# Patient Record
Sex: Female | Born: 1995 | Race: White | Hispanic: No | Marital: Single | Smoking: Never smoker
Health system: Southern US, Community
[De-identification: ages and names within clinical notes are randomized; demographics above are authoritative.]

## PROBLEM LIST (undated history)

## (undated) DIAGNOSIS — I10 Essential (primary) hypertension: Secondary | ICD-10-CM

## (undated) DIAGNOSIS — N7011 Chronic salpingitis: Secondary | ICD-10-CM

## (undated) DIAGNOSIS — T7840XA Allergy, unspecified, initial encounter: Secondary | ICD-10-CM

## (undated) DIAGNOSIS — F419 Anxiety disorder, unspecified: Secondary | ICD-10-CM

## (undated) DIAGNOSIS — N83209 Unspecified ovarian cyst, unspecified side: Secondary | ICD-10-CM

## (undated) HISTORY — PX: COLON SURGERY: SHX602

## (undated) HISTORY — DX: Essential (primary) hypertension: I10

## (undated) HISTORY — DX: Chronic salpingitis: N70.11

## (undated) HISTORY — DX: Unspecified ovarian cyst, unspecified side: N83.209

## (undated) HISTORY — PX: TONSILLECTOMY: SUR1361

## (undated) HISTORY — DX: Anxiety disorder, unspecified: F41.9

## (undated) HISTORY — DX: Allergy, unspecified, initial encounter: T78.40XA

---
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
## 2014-05-19 DIAGNOSIS — N809 Endometriosis, unspecified: Secondary | ICD-10-CM | POA: Insufficient documentation

## 2014-06-23 ENCOUNTER — Emergency Department: Payer: Self-pay | Admitting: Emergency Medicine

## 2014-06-23 DIAGNOSIS — E669 Obesity, unspecified: Secondary | ICD-10-CM | POA: Insufficient documentation

## 2014-06-23 DIAGNOSIS — Z8742 Personal history of other diseases of the female genital tract: Secondary | ICD-10-CM | POA: Insufficient documentation

## 2014-06-23 DIAGNOSIS — K5909 Other constipation: Secondary | ICD-10-CM | POA: Insufficient documentation

## 2014-06-23 DIAGNOSIS — N7011 Chronic salpingitis: Secondary | ICD-10-CM | POA: Insufficient documentation

## 2014-06-23 LAB — CBC
HCT: 38.1 % (ref 35.0–47.0)
HGB: 12 g/dL (ref 12.0–16.0)
MCH: 26.5 pg (ref 26.0–34.0)
MCHC: 31.6 g/dL — AB (ref 32.0–36.0)
MCV: 84 fL (ref 80–100)
PLATELETS: 300 10*3/uL (ref 150–440)
RBC: 4.54 10*6/uL (ref 3.80–5.20)
RDW: 15.3 % — ABNORMAL HIGH (ref 11.5–14.5)
WBC: 13.1 10*3/uL — ABNORMAL HIGH (ref 3.6–11.0)

## 2014-06-23 LAB — COMPREHENSIVE METABOLIC PANEL
Albumin: 3.7 g/dL — ABNORMAL LOW (ref 3.8–5.6)
Alkaline Phosphatase: 56 U/L
Anion Gap: 8 (ref 7–16)
BUN: 11 mg/dL (ref 7–18)
Bilirubin,Total: 0.7 mg/dL (ref 0.2–1.0)
CALCIUM: 8.9 mg/dL — AB (ref 9.0–10.7)
Chloride: 105 mmol/L (ref 98–107)
Co2: 24 mmol/L (ref 21–32)
Creatinine: 0.71 mg/dL (ref 0.60–1.30)
EGFR (Non-African Amer.): >60
Glucose: 89 mg/dL (ref 65–99)
Osmolality: 273 (ref 275–301)
Potassium: 4.1 mmol/L (ref 3.5–5.1)
SGOT(AST): 22 U/L (ref 0–26)
SGPT (ALT): 20 U/L
SODIUM: 137 mmol/L (ref 136–145)
Total Protein: 7.8 g/dL (ref 6.4–8.6)

## 2014-12-01 IMAGING — CT CT ABD-PELV W/ CM
2 of 4 series · 16 of 46 positions shown, 18 images · IV contrast (isovue)
Comparison: None.

CLINICAL DATA: Lower abdominal pain.  Leukocytosis.  Nausea.

EXAM:
CT ABDOMEN AND PELVIS WITH CONTRAST
TECHNIQUE: Multidetector CT imaging of the abdomen and pelvis was performed
using the standard protocol following bolus administration of
intravenous contrast.
CONTRAST:  100 cc Isovue 300

[Series 2: routine abd pel with · axial · 0.85mm/px · z∈[-1104,-624]mm · 13 of 106 slices shown, 15 images]
[im 5/106  soft-tissue]
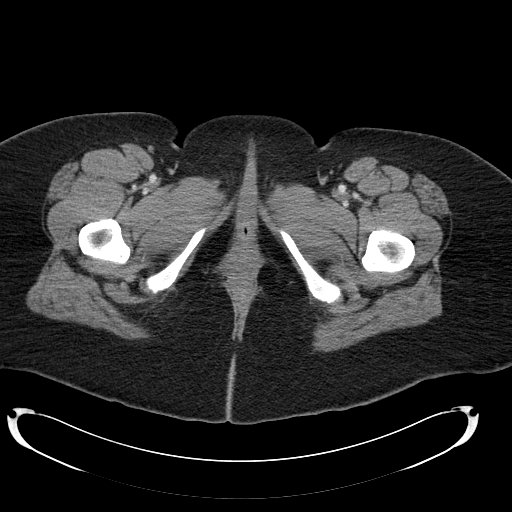
[im 5/106  bone]
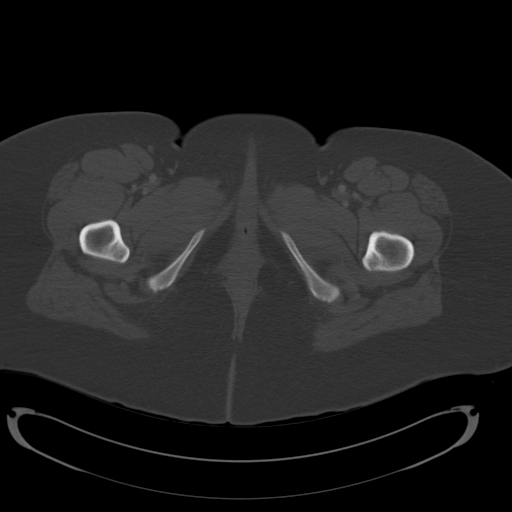
[im 13/106  soft-tissue]
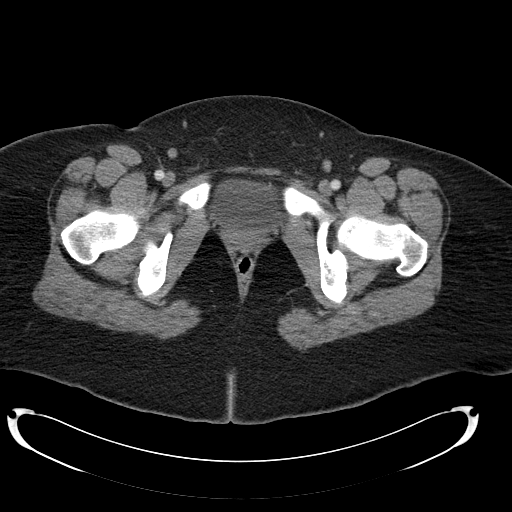
[im 22/106  soft-tissue]
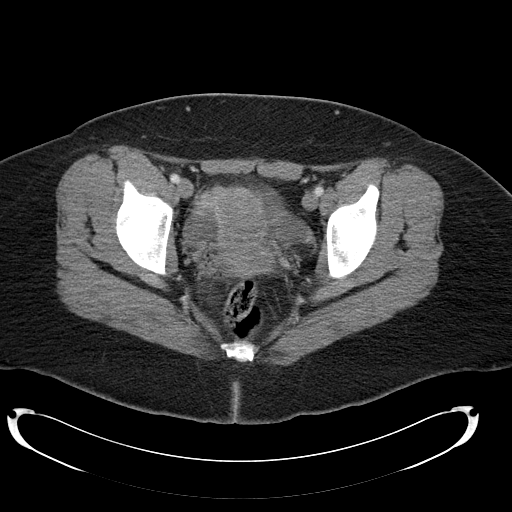
[im 30/106  soft-tissue]
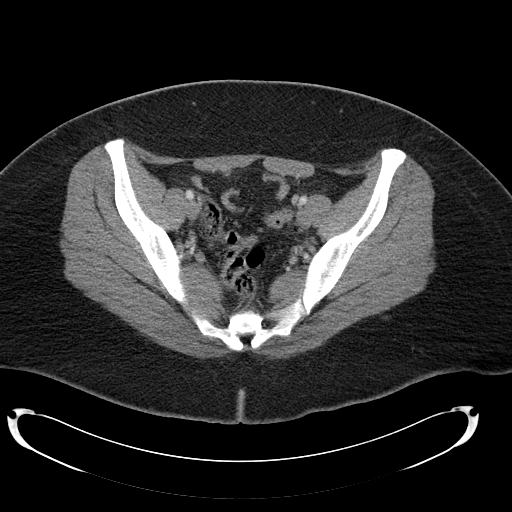
[im 38/106  soft-tissue]
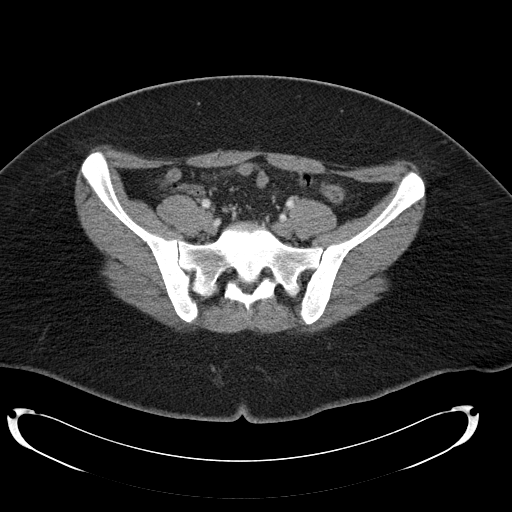
[im 47/106  soft-tissue]
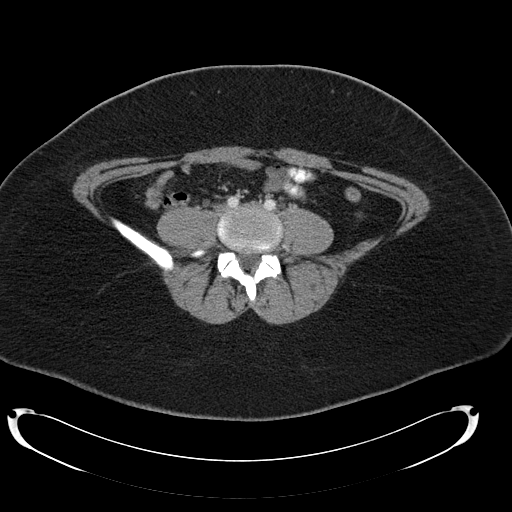
[im 55/106  soft-tissue]
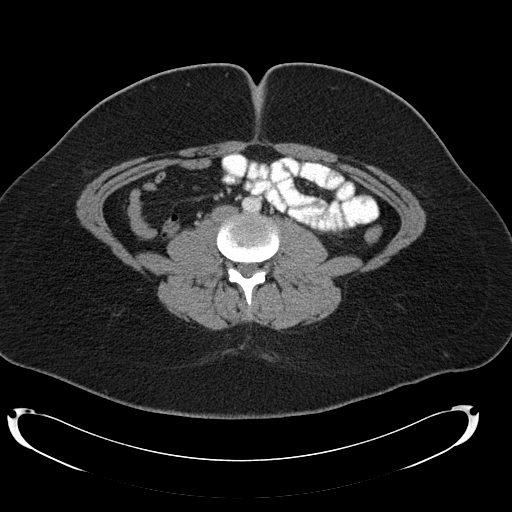
[im 59/106  soft-tissue]
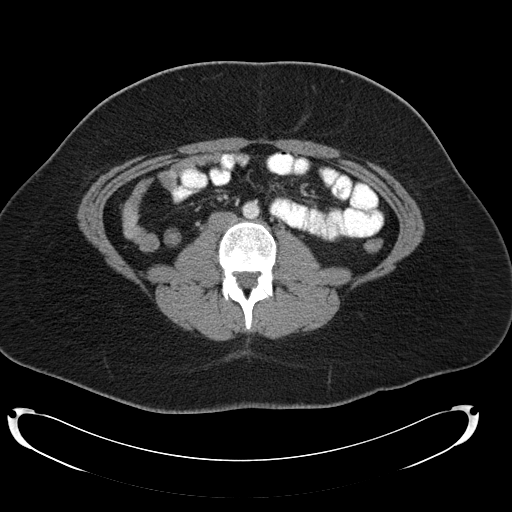
[im 68/106  soft-tissue]
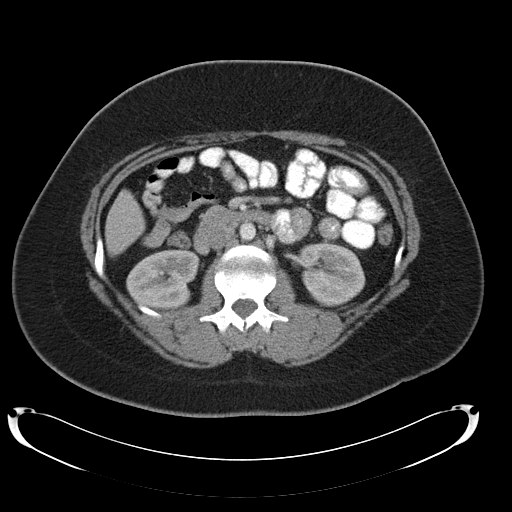
[im 68/106  bone]
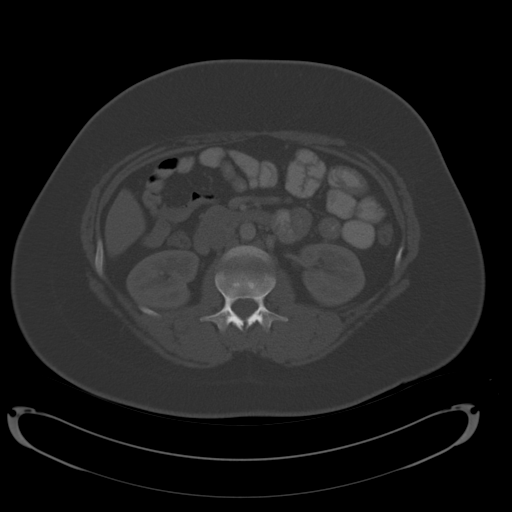
[im 76/106  soft-tissue]
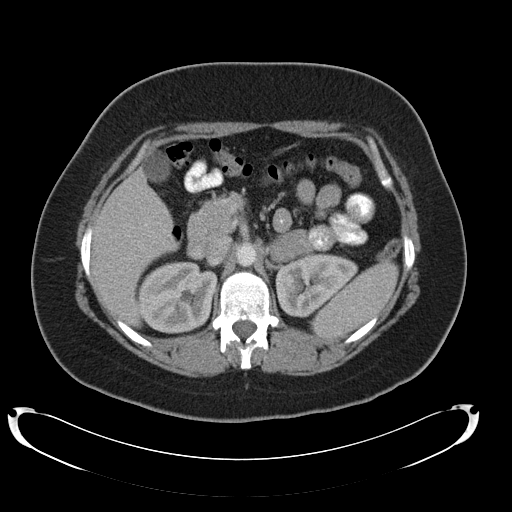
[im 85/106  soft-tissue]
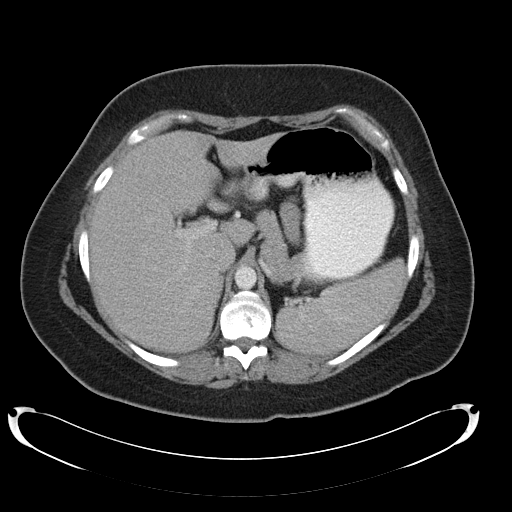
[im 93/106  soft-tissue]
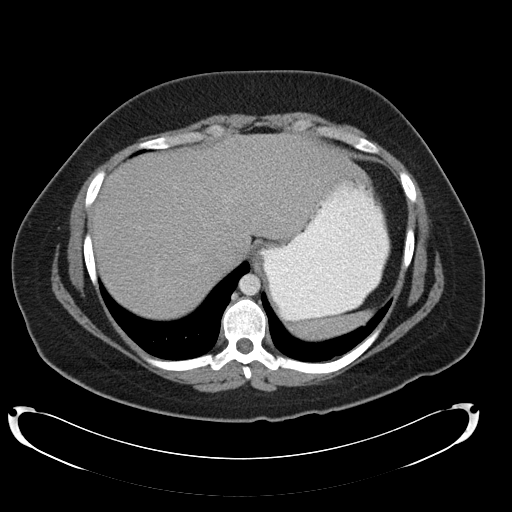
[im 101/106  soft-tissue]
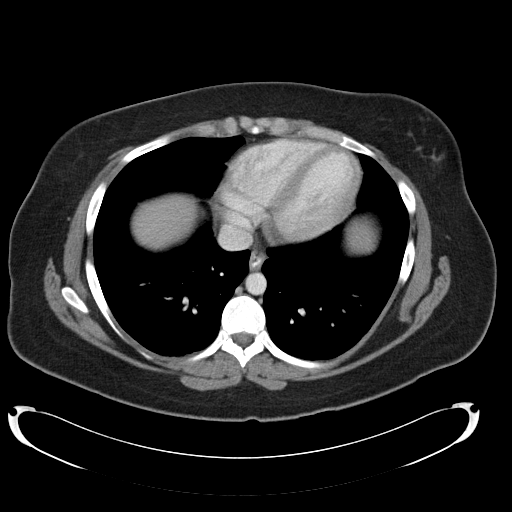

[Series 5: cor routine abd pel with · coronal · 0.78mm/px · 3 of 144 slices shown]
[im 48/144  soft-tissue]
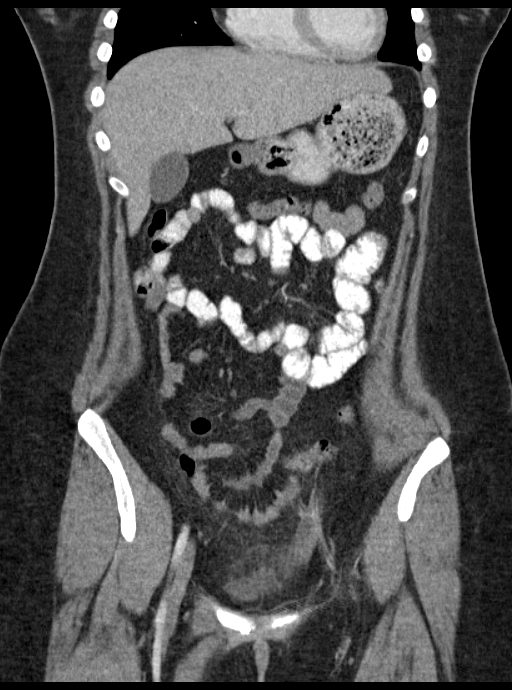
[im 64/144  soft-tissue]
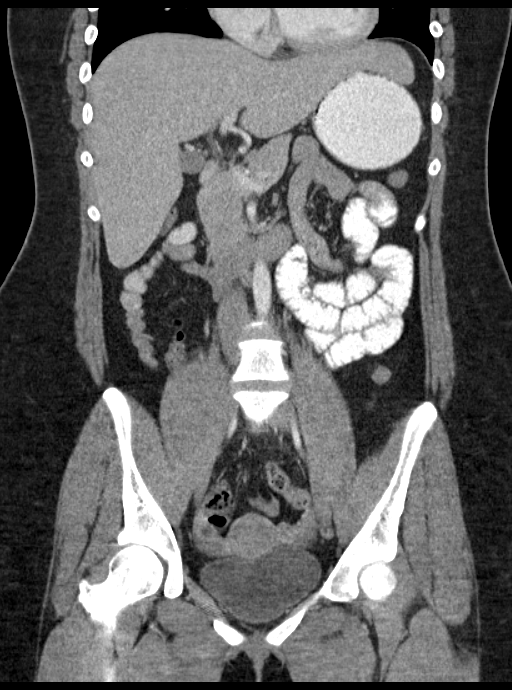
[im 80/144  soft-tissue]
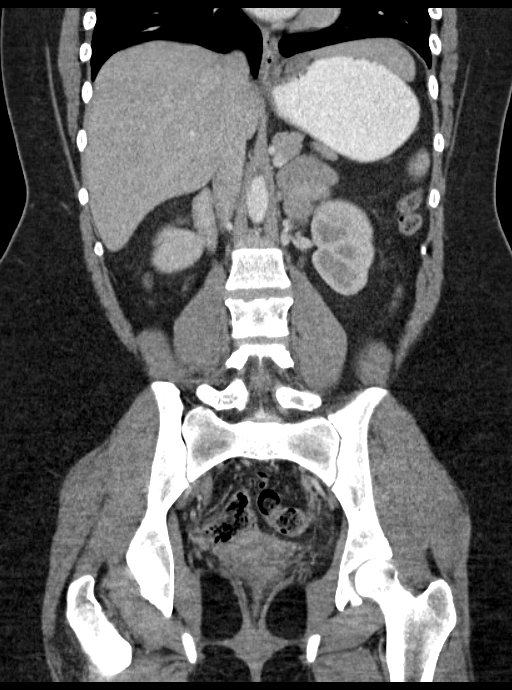

[16 of 46 positions shown; findings below may reference images not displayed]

FINDINGS: Lower chest:  Intact

Hepatobiliary: Intact

Spleen: Intact

Pancreas: Intact

Stomach/Bowel: The cecum sits down in the pelvis just above the
right ovary. Appendix normal. No dilated bowel. Subtle stranding
along the small bowel mesentery in the pelvis.

Adrenals/urinary tract: Negative

Vascular/Lymphatic: Negative

Reproductive: Negative

Musculoskeletal:

Right unilateral pars defect at L5 appears chronic. No significant
anterolisthesis or impingement at this level. Multilevel Schmorl' s
nodes in the lumbar spine and thoracic spine, advanced for age.

Other: No overt ascites.
IMPRESSION: 1. Subtle stranding along the small bowel mesentery in the pelvis,
query Mild enteritis.
2. Chronic right unilateral pars defect at L5.

## 2015-01-29 NOTE — Op Note (Signed)
PATIENT NAMEilleen Yates:  Tye, Zell J MR#:  [id number] DATE OF BIRTH:  1996-05-01  DATE OF PROCEDURE:  05/22/2013  PREOPERATIVE DIAGNOSIS: Chronic pelvic pain.   POSTOPERATIVE DIAGNOSIS: Chronic pelvic pain.   PROCEDURE PERFORMED: Diagnostic laparoscopy with peritoneal biopsies, as well as cornual serosal biopsy.  ANESTHESIA: General   PRIMARY SURGEON: Vena AustriaAndreas Jeri Rawlins, MD.  DRAINS OR TUBES: None.  IMPLANTS: None.   ESTIMATED BLOOD LOSS: Minimal.  OPERATIVE FLUIDS: 800 mL of crystalloid.   URINE OUTPUT: 50 mL straight catheter at beginning of the case.   COMPLICATIONS: None.   FINDINGS: There were noted to be some small granulomatous changes involving the right cornual portion of the right fallopian tube. In addition, some filmy adhesions involving the right tube were visualized. The cul-de-sac, ureters, liver, appendix and bowel appeared grossly normal. There was no evidence of any hernias. Random cul-de-sac biopsy was taken. Both ovarian fossae were visualized and appeared free of endometriosis.   SPECIMENS REMOVED: Peritoneal cul-de-sac biopsy and cornual serosal biopsy.  CONDITION FOLLOWING PROCEDURE:  Stable.   PROCEDURE IN DETAIL: Risks, benefits and alternatives of the procedure were discussed with the patient prior to proceeding to the operating room. She was placed under general endotracheal anesthesia, positioned in the dorsal lithotomy position, prepped and draped in the usual fashion. A timeout was performed. Attention was then turned to the patient's pelvis. A sterile speculum was placed. The cervix was visualized. The anterior lip grasped with a single-tooth tenaculum and sounded to 8 cm. A Hulka tenaculum was then placed before removing the single-tooth tenaculum and speculum. Attention was then turned to the patient's abdomen. The umbilicus was infiltrated with 1% lidocaine. A stab incision was made at the base of the umbilicus and peritoneal entry was gained using direct  visualization and an XL trocar. Once pneumoperitoneum had been established, a second right lateral 5 mm assistant port was placed under direct visualization. The findings of the intraabdominal survey were as noted above. The right cornual portion of the uterus had several small granulomatous changes. These were superficially biopsied as not to damage the lumen of the tube. A random cul-de-sac biopsy was also taken. Following this, the pneumoperitoneum was evacuated. The trocars were removed. The trocar sites were dressed with Dermabond. The umbilical trocar site was closed with 4-0 Monocryl subcuticular as well. The Hulka tenaculum was removed as well. Sponge, needle and instrument counts were correct x 2. The patient tolerated the procedure well and was taken to the recovery room in stable condition.    ____________________________ Florina OuAndreas M. Bonney AidStaebler, MD ams:ce D: 05/23/2013 09:52:39 ET T: 05/23/2013 10:03:30 ET JOB#: [id number]  cc: Florina OuAndreas M. Bonney AidStaebler, MD, <Dictator> Lorrene ReidANDREAS M Antasia Haider MD ELECTRONICALLY SIGNED 06/10/2013 22:42

## 2015-05-14 ENCOUNTER — Ambulatory Visit (INDEPENDENT_AMBULATORY_CARE_PROVIDER_SITE_OTHER): Payer: No Typology Code available for payment source | Admitting: Family Medicine

## 2015-05-14 ENCOUNTER — Encounter: Payer: Self-pay | Admitting: Family Medicine

## 2015-05-14 VITALS — BP 112/70 | HR 88 | Temp 98.1°F | Resp 16 | Ht 69.0 in | Wt 269.0 lb

## 2015-05-14 DIAGNOSIS — H109 Unspecified conjunctivitis: Secondary | ICD-10-CM | POA: Diagnosis not present

## 2015-05-14 DIAGNOSIS — J329 Chronic sinusitis, unspecified: Secondary | ICD-10-CM | POA: Diagnosis not present

## 2015-05-14 MED ORDER — AMOXICILLIN-POT CLAVULANATE 875-125 MG PO TABS: 1.0000 | ORAL_TABLET | Freq: Two times a day (BID) | ORAL | Status: DC

## 2015-05-14 NOTE — Progress Notes (Signed)
Patient ID: Ashley Yates, female   DOB: July 18, 1996, 19 y.o.   MRN: [id number]    Subjective:  HPI Pt is here today complaining of eye redness, drainage (yellow and clear), itching and light sensitive. It started a week ago today. She reports that she went to the walk in clinic this past Sunday. She was diagnosed with Pink eye and given Gentamicin Sulfate. Pt reports that it is not working. It started in the right eye and spread to the opposite eye yesterday. She reports that her vision is blurry in the right eye and her eyes keep draining. When she puts the eye drops in her right eye she says it comes out even when she lays back and sits there for a little while.   Prior to Admission medications   Medication Sig Start Date End Date Taking? Authorizing Provider  gentamicin (GARAMYCIN) 0.3 % ophthalmic solution INT 1 GTT IN OD Q 4 H 05/09/15   Historical Provider, MD  [facility] 1/35, 28, tablet TK 1 T PO D 04/30/15   Historical Provider, MD    Patient Active Problem List   Diagnosis Date Noted  . Chronic constipation 06/23/2014  . 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 Adiposity 06/23/2014  . 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 No pertinent past medical history.  History   Social History  . Marital Status: Single    Spouse Name: N/A  . Number of Children: N/A  . Years of Education: N/A   Occupational History  . Not on file.   Social History Main Topics  . Smoking status: Never Smoker   . Smokeless tobacco: Not on file  . Alcohol Use: No  . Drug Use: No  . Sexual Activity: Not on file   Other Topics Concern  . Not on file   Social History Narrative  . No narrative on file    Not on File  Review of Systems  Constitutional: Negative.   HENT: Negative.   Eyes: Positive for blurred vision, photophobia, pain, discharge and redness.  Cardiovascular: Negative.   Gastrointestinal: Negative.   Genitourinary: Negative.   Musculoskeletal: Negative.   Skin:  Negative.   Neurological: Negative.   Endo/Heme/Allergies: Negative.   Psychiatric/Behavioral: Negative.      There is no immunization history on file for this patient. Objective:  BP 112/70 mmHg  Pulse 88  Temp(Src) 98.1 F (36.7 C) (Oral)  Resp 16  Ht  (1.753 m)  Wt 269 lb (122.018 kg)  BMI 39.71 kg/m2  LMP 04/30/2015 (Approximate)  Physical Exam  Constitutional: She is well-developed, well-nourished, and in no distress.  HENT:  Head: Normocephalic and atraumatic.  Right Ear: External ear normal.  Left Ear: External ear normal.  Nose: Mucosal edema present.  Mouth/Throat: Oropharynx is clear and moist.  Eyes: EOM are normal. Pupils are equal, round, and reactive to light. Left eye exhibits discharge (right lids swollen. ). Right conjunctiva is injected. Left conjunctiva is injected.    Assessment and Plan :  1. Other sinusitis Condition is stable. Please continue current medication and  plan of care as noted.   - amoxicillin-clavulanate (AUGMENTIN) 875-125 MG per tablet; Take 1 tablet by mouth 2 (two) times daily.  Dispense: 20 tablet; Refill: 0  2. Bilateral conjunctivitis Worsening. Will refer to [facility].     [facility] 05/14/2015 2:07 PM

## 2015-05-15 DIAGNOSIS — H109 Unspecified conjunctivitis: Secondary | ICD-10-CM | POA: Insufficient documentation

## 2016-08-02 ENCOUNTER — Ambulatory Visit
Admission: EM | Admit: 2016-08-02 | Discharge: 2016-08-02 | Disposition: A | Discharge status: Home / Self Care | Payer: 59 | Attending: Family Medicine | Admitting: Family Medicine

## 2016-08-02 DIAGNOSIS — S29012A Strain of muscle and tendon of back wall of thorax, initial encounter: Secondary | ICD-10-CM | POA: Diagnosis not present

## 2016-08-02 DIAGNOSIS — R202 Paresthesia of skin: Secondary | ICD-10-CM

## 2016-08-02 DIAGNOSIS — R509 Fever, unspecified: Secondary | ICD-10-CM | POA: Diagnosis not present

## 2016-08-02 MED ORDER — CYCLOBENZAPRINE HCL 10 MG PO TABS: 10.0000 mg | ORAL_TABLET | Freq: Every day | ORAL | 0 refills | Status: DC

## 2016-08-02 NOTE — ED Triage Notes (Signed)
Patient complains of left arm numbness that started around 1am this morning. Patient states that it woke her up and she states that arm is still numb. Patient states that numbness starts about mid deltoid and radiates down through hand.

## 2016-08-09 NOTE — ED Provider Notes (Signed)
[facility]    CSN: [id number] Arrival date & time: 08/02/16  1229     History   Chief Complaint Chief Complaint  Patient presents with  . Numbness    HPI Ashley Yates is a 20 y.o. female.   20 yo female with a c/o left arm numbness that started around 1am this morning. Patient states that it woke her up and she states that arm is still numb. Patient states that numbness starts about mid deltoid and radiates down through hand. Denies any injury, trauma, fevers, chills, rash, weakness. Complains of pain to the left upper back area.    The history is provided by the patient.    History reviewed. No pertinent past medical history.  Patient Active Problem List   Diagnosis Date Noted  . Conjunctivitis 05/15/2015  . Sinusitis 05/14/2015  . Chronic constipation 06/23/2014  . 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 Adiposity 06/23/2014  . 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 COLON SURGERY     Exploratory surgery and colonscopy   . TONSILLECTOMY  at age 20    OB History    No data available       Home Medications    Prior to Admission medications   Medication Sig Start Date End Date Taking? Authorizing Provider  amoxicillin-clavulanate (AUGMENTIN) 875-125 MG per tablet Take 1 tablet by mouth 2 (two) times daily. 05/14/15   Lorie PhenixNancy Maloney, MD  cyclobenzaprine (FLEXERIL) 10 MG tablet Take 1 tablet (10 mg total) by mouth at bedtime. 08/02/16   Payton Mccallumrlando Jeramey Lanuza, MD  gentamicin (GARAMYCIN) 0.3 % ophthalmic solution INT 1 GTT IN OD Q 4 H 05/09/15   Historical Provider, MD  [facility], tablet TK 1 T PO D 04/30/15   Historical Provider, MD    Family History Family History  Problem Relation Age of Onset  . Kidney Stones Mother   . Hypertension Father   . Hyperlipidemia Father     Social History Social History  Substance Use Topics  . Smoking status: Never Smoker  . Smokeless tobacco: Never Used  .  Alcohol use Yes     Comment: occasionally     Allergies   Review of patient's allergies indicates no known allergies.   Review of Systems Review of Systems   Physical Exam Triage Vital Signs ED Triage Vitals  Enc Vitals Group     BP 08/02/16 1330 134/79     Pulse Rate 08/02/16 1330 75     Resp 08/02/16 1330 17     Temp 08/02/16 1330 98.2 F (36.8 C)     Temp Source 08/02/16 1330 Oral     SpO2 08/02/16 1330 100 %     Weight 08/02/16 1327 290 lb (131.5 kg)     Height 08/02/16 1327 5\' 9"  (1.753 m)     Head Circumference --      Peak Flow --      Pain Score 08/02/16 1329 0     Pain Loc --      Pain Edu? --      Excl. in GC? --    No data found.   Updated Vital Signs BP 134/79 (BP Location: Right Arm)   Pulse 75   Temp 98.2 F (36.8 C) (Oral)   Resp 17   Ht 5\' 9"  (1.753 m)   Wt 290 lb (131.5 kg)   LMP 07/24/2016   SpO2 100%   BMI 42.83 kg/m  Visual Acuity Right Eye Distance:   Left Eye Distance:   Bilateral Distance:    Right Eye Near:   Left Eye Near:    Bilateral Near:     Physical Exam  Constitutional: She is oriented to person, place, and time. She appears well-developed and well-nourished. No distress.  HENT:  Head: Normocephalic and atraumatic.  Eyes: EOM are normal. Pupils are equal, round, and reactive to light.  Neck: Normal range of motion. Neck supple. No tracheal deviation present. No thyromegaly present.  Musculoskeletal: She exhibits no edema.       Left shoulder: Normal.       Cervical back: She exhibits tenderness (over the left trapezius muscle) and spasm. She exhibits normal range of motion, no bony tenderness, no swelling, no edema, no deformity, no laceration and normal pulse.       Back:       Arms: Lymphadenopathy:    She has no cervical adenopathy.  Neurological: She is alert and oriented to person, place, and time. She has normal reflexes. No cranial nerve deficit. She exhibits normal muscle tone. Coordination normal.  Skin:  She is not diaphoretic.  Nursing note and vitals reviewed.    UC Treatments / Results  Labs (all labs ordered are listed, but only abnormal results are displayed) Labs Reviewed - No data to display  EKG  EKG Interpretation None       Radiology No results found.  Procedures Procedures (including critical care time)  Medications Ordered in UC Medications - No data to display   Initial Impression / Assessment and Plan / UC Course  I have reviewed the triage vital signs and the nursing notes.  Pertinent labs & imaging results that were available during my care of the patient were reviewed by me and considered in my medical decision making (see chart for details).  Clinical Course      Final Clinical Impressions(s) / UC Diagnoses   Final diagnoses:  Paresthesia  Upper back strain, initial encounter    New Prescriptions Discharge Medication List as of 08/02/2016  2:37 PM    START taking these medications   Details  cyclobenzaprine (FLEXERIL) 10 MG tablet Take 1 tablet (10 mg total) by mouth at bedtime., Starting Wed 08/02/2016, Normal       1. diagnosis reviewed with patient 2. rx as per orders above; reviewed possible side effects, interactions, risks and benefits  3. Recommend supportive treatment with rest, ice 4. Follow-up prn if symptoms worsen or don't improve   Payton Mccallumrlando Chantille Navarrete, MD 08/09/16 [phone]

## 2016-09-22 HISTORY — PX: INTRAUTERINE DEVICE (IUD) INSERTION: SHX5877

## 2016-10-29 DIAGNOSIS — H6691 Otitis media, unspecified, right ear: Secondary | ICD-10-CM | POA: Diagnosis not present

## 2016-11-16 DIAGNOSIS — H93291 Other abnormal auditory perceptions, right ear: Secondary | ICD-10-CM | POA: Diagnosis not present

## 2016-11-16 DIAGNOSIS — H6980 Other specified disorders of Eustachian tube, unspecified ear: Secondary | ICD-10-CM | POA: Diagnosis not present

## 2016-11-16 DIAGNOSIS — H9201 Otalgia, right ear: Secondary | ICD-10-CM | POA: Diagnosis not present

## 2016-11-16 DIAGNOSIS — R42 Dizziness and giddiness: Secondary | ICD-10-CM | POA: Diagnosis not present

## 2017-01-17 ENCOUNTER — Ambulatory Visit: Payer: Self-pay | Admitting: Obstetrics and Gynecology

## 2017-02-16 ENCOUNTER — Encounter: Payer: Self-pay | Admitting: Obstetrics and Gynecology

## 2017-02-22 ENCOUNTER — Ambulatory Visit (INDEPENDENT_AMBULATORY_CARE_PROVIDER_SITE_OTHER): Payer: 59 | Admitting: Obstetrics and Gynecology

## 2017-02-22 ENCOUNTER — Encounter: Payer: Self-pay | Admitting: Obstetrics and Gynecology

## 2017-02-22 VITALS — BP 130/84 | HR 64 | Ht 69.0 in | Wt 300.0 lb

## 2017-02-22 DIAGNOSIS — Z124 Encounter for screening for malignant neoplasm of cervix: Secondary | ICD-10-CM

## 2017-02-22 DIAGNOSIS — Z131 Encounter for screening for diabetes mellitus: Secondary | ICD-10-CM

## 2017-02-22 DIAGNOSIS — Z0000 Encounter for general adult medical examination without abnormal findings: Secondary | ICD-10-CM | POA: Diagnosis not present

## 2017-02-22 DIAGNOSIS — IMO0001 Reserved for inherently not codable concepts without codable children: Secondary | ICD-10-CM

## 2017-02-22 DIAGNOSIS — Z01419 Encounter for gynecological examination (general) (routine) without abnormal findings: Secondary | ICD-10-CM | POA: Diagnosis not present

## 2017-02-22 DIAGNOSIS — E669 Obesity, unspecified: Secondary | ICD-10-CM

## 2017-02-22 DIAGNOSIS — Z6841 Body Mass Index (BMI) 40.0 and over, adult: Secondary | ICD-10-CM | POA: Diagnosis not present

## 2017-02-22 DIAGNOSIS — N921 Excessive and frequent menstruation with irregular cycle: Secondary | ICD-10-CM | POA: Diagnosis not present

## 2017-02-22 DIAGNOSIS — Z30431 Encounter for routine checking of intrauterine contraceptive device: Secondary | ICD-10-CM

## 2017-02-22 MED ORDER — PHENTERMINE HCL 37.5 MG PO TABS: 37.5000 mg | ORAL_TABLET | Freq: Every day | ORAL | 0 refills | Status: DC

## 2017-02-22 NOTE — Progress Notes (Signed)
Patient ID: Ashley Yates, female   DOB: 1996-02-15, 21 y.o.   MRN: [id number]     Gynecology Annual Exam  PCP: Lorie Phenix, MD  Chief Complaint:  Chief Complaint  Patient presents with  . Gynecologic Exam    IUD check  . Weight Check    History of Present Illness: Patient is a 21 y.o. G0P0000 presents for annual exam. The patient has no complaints today.   LMP: No LMP recorded. Patient is not currently having periods (Reason: IUD).  The patient is sexually active. She currently uses IUD for contraception. She denies dyspareunia.  The patient does not perform self breast exams.  There is no notable family history of breast or ovarian cancer in her family.   The patient wears seat belts.    The patient denies current symptoms of depression.     Patientis a 21 y.o. G0P0000 female, who presents for the evaluation of weight gain. She has gained 7 pounds primarily over last year. The patient states the following issues have contributed to her weight problem: decreased physical activity, dietary choices.  The patient has no additional symptoms. The patient specifically denies memory loss, muscle weakness, excessive thirst, and polyuria. Weight related co-morbidities include none. She has tried no interventions in the past.   Review of Systems: Review of Systems  Constitutional: Negative for chills and fever.  HENT: Negative for congestion.   Respiratory: Negative for cough and shortness of breath.   Cardiovascular: Negative for chest pain and palpitations.  Gastrointestinal: Negative for abdominal pain, constipation, diarrhea, heartburn, nausea and vomiting.  Genitourinary: Negative for dysuria, frequency and urgency.  Skin: Negative for itching and rash.  Neurological: Negative for dizziness and headaches.  Endo/Heme/Allergies: Negative for polydipsia.  Psychiatric/Behavioral: Negative for depression.    Past Medical History:  Past Medical History:  Diagnosis Date  . Ovarian  cyst   . 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 COLON SURGERY     Exploratory surgery and colonscopy   . DIAGNOSTIC LAPAROSCOPY  05/22/2013  . INTRAUTERINE DEVICE (IUD) INSERTION  09/22/2016  . TONSILLECTOMY  at age 33    Gynecologic History:  No LMP recorded. Patient is not currently having periods (Reason: IUD). Contraception: Lo Loestrin FE Last Pap: Results were: not previously obtained   Obstetric History: G0P0000  Family History:  Family History  Problem Relation Age of Onset  . Kidney Stones Mother   . Polycystic ovary syndrome Mother   . Hypertension Father   . Hyperlipidemia Father   . Polycystic ovary syndrome Maternal Grandmother   . Colon cancer Maternal Grandfather 79    Social History:  Social History   Social History  . Marital status: Single    Spouse name: N/A  . Number of children: N/A  . Years of education: N/A   Occupational History  . Not on file.   Social History Main Topics  . Smoking status: Never Smoker  . Smokeless tobacco: Never Used  . Alcohol use Yes     Comment: occasionally  . Drug use: No  . Sexual activity: Yes   Other Topics Concern  . Not on file   Social History Narrative  . No narrative on file    Allergies:  Allergies  Allergen Reactions  . Etodolac Hives and Swelling    Medications: Prior to Admission medications   Medication Sig Start Date End  Date Taking? Authorizing Provider  levonorgestrel (MIRENA) 20 MCG/24HR IUD 1 each by Intrauterine route once.   Yes Physicians, [employer]    Physical Exam Vitals: Blood pressure 130/84, pulse 64, height 5\' 9"  (1.753 m), weight 300 lb (136.1 kg). Body mass index is 44.3 kg/m.  General: NAD HEENT: normocephalic, anicteric Thyroid: no enlargement, no palpable nodules Pulmonary: No increased work of breathing, CTAB Cardiovascular: RRR, distal  pulses 2+ Breast: Breast symmetrical, no tenderness, no palpable nodules or masses, no skin or nipple retraction present, no nipple discharge.  No axillary or supraclavicular lymphadenopathy. Abdomen: NABS, soft, non-tender, non-distended.  Umbilicus without lesions.  No hepatomegaly, splenomegaly or masses palpable. No evidence of hernia  Genitourinary:  External: Normal external female genitalia.  Normal urethral meatus, normal  Bartholin's and Skene's glands.    Vagina: Normal vaginal mucosa, no evidence of prolapse.    Cervix: Grossly normal in appearance, no bleeding  Uterus: Non-enlarged, mobile, normal contour.  No CMT  Adnexa: ovaries non-enlarged, no adnexal masses  Rectal: deferred  Lymphatic: no evidence of inguinal lymphadenopathy Extremities: no edema, erythema, or tenderness Neurologic: Grossly intact Psychiatric: mood appropriate, affect full  Female chaperone present for pelvic and breast  portions of the physical exam    Assessment: 21 y.o. G0P0000 routine annual   Plan: Problem List Items Addressed This Visit      Other   Adiposity   Relevant Medications   phentermine (ADIPEX-P) 37.5 MG tablet   Other Relevant Orders   Hemoglobin A1c   TSH+Prl+FSH+TestT+LH+DHEA S...   US Transvaginal Non-OB    Other Visit Diagnoses    IUD check up    -  Primary   Relevant Orders   US Transvaginal Non-OB   Screening for malignant neoplasm of cervix       Relevant Orders   Pap IG w/ reflex to HPV when ASC-U   Screening for diabetes mellitus       Relevant Orders   Hemoglobin A1c   Encounter for gynecological examination without abnormal finding       Relevant Orders   Pap IG w/ reflex to HPV when ASC-U   TSH+Prl+FSH+TestT+LH+DHEA S...   Menorrhagia with irregular cycle         ANNUAL   1) 4) Gardasil Series discussed and if applicable offered to patient - Patient has not previously completed 3 shot series , declined  2) STI screening was offered and  declined   3) ASCCP guidelines and rational discussed.  Patient opts for every 3 years screening interval  4) Contraception - continue LoLoestrin FE   5) Follow up 1 year for routine annual exam  WEIGHT LOSS  1) 1500 Calorie ADA Diet  2) Patient education given regarding appropriate lifestyle changes for weight loss including: regular physical activity, healthy coping strategies, caloric restriction and healthy eating patterns.  3) Patient will be started on weight loss medication. The risks and benefits and side effects of medication, such as Adipex (Phenteramine) ,  Tenuate (Diethylproprion), Belviq (lorcarsin), Contrave (buproprion/naltrexone), Qsymia (phentermine/topiramate), and Saxenda (liraglutide) is discussed. The pros and cons of suppressing appetite and boosting metabolism is discussed. Risks of tolerence and addiction is discussed for selected agents discussed. Use of medicine will ne short term, such as 3-4 months at a time followed by a period of time off of the medicine to avoid these risks and side effects for Adipex, Qsymia, and Tenuate discussed. Pt to call with any negative side effects and agrees to keep follow up appts.  4) Comorbidity Screening - hypothyroidism screening, diabetes, and hyperlipidemia screening offered  5) Encouraged weekly weight monitorig to track progress and sample 1 week food diary  6) Contraception - discussed that all weight loss drugs fall in to pregnancy category X, patient currently has reliable contraception in the form of loloestrin FE  7) 15 minutes face-to-face; counseling/coordination of care > 50 percent of visit  8) Follow up in 4 weeks to assess response

## 2017-02-22 NOTE — Patient Instructions (Signed)

## 2017-02-28 DIAGNOSIS — L0390 Cellulitis, unspecified: Secondary | ICD-10-CM | POA: Diagnosis not present

## 2017-03-01 LAB — PAP IG W/ RFLX HPV ASCU: PAP Smear Comment: 0

## 2017-03-01 LAB — TSH+PRL+FSH+TESTT+LH+DHEA S...
17-Hydroxyprogesterone: 48 ng/dL
Androstenedione: 161 ng/dL (ref 41–262)
DHEA-SO4: 383.5 ug/dL (ref 110.0–431.7)
FSH: 6.8 m[IU]/mL
LH: 13.4 m[IU]/mL
Prolactin: 11.8 ng/mL (ref 4.8–23.3)
TSH: 1.82 u[IU]/mL (ref 0.450–4.500)
Testosterone, Free: 4.2 pg/mL (ref 0.0–4.2)
Testosterone: 61 ng/dL — ABNORMAL HIGH (ref 8–48)

## 2017-03-01 LAB — HEMOGLOBIN A1C
Est. average glucose Bld gHb Est-mCnc: 105 mg/dL
Hgb A1c MFr Bld: 5.3 % (ref 4.8–5.6)

## 2017-03-01 LAB — HPV DNA PROBE HIGH RISK, AMPLIFIED: HPV, high-risk: POSITIVE — AB

## 2017-03-07 ENCOUNTER — Telehealth: Payer: Self-pay

## 2017-03-07 NOTE — Telephone Encounter (Signed)
AMS please call pt with results

## 2017-03-07 NOTE — Telephone Encounter (Signed)
Pt received lab results thru my chart & she has questions for AMS regarding this.

## 2017-03-26 ENCOUNTER — Ambulatory Visit (INDEPENDENT_AMBULATORY_CARE_PROVIDER_SITE_OTHER): Payer: 59 | Admitting: Obstetrics and Gynecology

## 2017-03-26 ENCOUNTER — Encounter: Payer: Self-pay | Admitting: Obstetrics and Gynecology

## 2017-03-26 VITALS — BP 134/98 | HR 88 | Ht 69.0 in | Wt 288.0 lb

## 2017-03-26 DIAGNOSIS — Z6841 Body Mass Index (BMI) 40.0 and over, adult: Secondary | ICD-10-CM | POA: Diagnosis not present

## 2017-03-26 DIAGNOSIS — IMO0001 Reserved for inherently not codable concepts without codable children: Secondary | ICD-10-CM

## 2017-03-26 DIAGNOSIS — E669 Obesity, unspecified: Secondary | ICD-10-CM | POA: Diagnosis not present

## 2017-03-26 MED ORDER — PHENTERMINE HCL 37.5 MG PO TABS: 37.5000 mg | ORAL_TABLET | Freq: Every day | ORAL | 0 refills | Status: DC

## 2017-03-26 NOTE — Progress Notes (Signed)
Gynecology Office Visit  Chief Complaint:  Chief Complaint  Patient presents with  . Weight Check    History of Present Illness: Patientis a 21 y.o. G0P0000 female, who presents for the evaluation of the desire to lose weight. She has lost 12 pounds in 1 month. The patient states the following symptoms since starting her weight loss therapy: appetite suppression, energy, and weight loss.  The patient also reports no other ill effects. The patient specifically denies heart palpitations, anxiety, and insomnia.    Review of Systems: 10 point review of systems negative unless otherwise noted in HPI  Past Medical History:  Past Medical History:  Diagnosis Date  . Ovarian cyst   . 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 COLON SURGERY     Exploratory surgery and colonscopy   . DIAGNOSTIC LAPAROSCOPY  05/22/2013  . INTRAUTERINE DEVICE (IUD) INSERTION  09/22/2016  . TONSILLECTOMY  at age 45    Gynecologic History: No LMP recorded. Patient is not currently having periods (Reason: IUD).  Obstetric History: G0P0000  Family History:  Family History  Problem Relation Age of Onset  . Kidney Stones Mother   . Polycystic ovary syndrome Mother   . Hypertension Father   . Hyperlipidemia Father   . Polycystic ovary syndrome Maternal Grandmother   . Colon cancer Maternal Grandfather 7    Social History:  Social History   Social History  . Marital status: Single    Spouse name: N/A  . Number of children: N/A  . Years of education: N/A   Occupational History  . Not on file.   Social History Main Topics  . Smoking status: Never Smoker  . Smokeless tobacco: Never Used  . Alcohol use Yes     Comment: occasionally  . Drug use: No  . Sexual activity: Yes   Other Topics Concern  . Not on file   Social History Narrative  . No narrative on file     Allergies:  Allergies  Allergen Reactions  . Etodolac Hives and Swelling    Medications: Prior to Admission medications   Medication Sig Start Date End Date Taking? Authorizing Provider  levonorgestrel (MIRENA) 20 MCG/24HR IUD 1 each by Intrauterine route once.    [employer], [employer]  mupirocin ointment (BACTROBAN) 2 % APP EXT AA TID 02/28/17   [provider]  phentermine (ADIPEX-P) 37.5 MG tablet Take 1 tablet (37.5 mg total) by mouth daily before breakfast. 02/22/17   Vena Austria, MD    Physical Exam Vitals:  Blood pressure (!) 134/98, pulse 88, height 5\' 9"  (1.753 m), weight 288 lb (130.6 kg). Body mass index is 42.53 kg/m.   No LMP recorded. Patient is not currently having periods (Reason: IUD).  General: NAD HEENT: normocephalic, anicteric Thyroid: no enlargement Pulmonary: no increased work of breathing Neurologic: Grossly intact Psychiatric: mood appropriate, affect full  Assessment: 21 y.o. G0P0000 medical weight loss follow up  Plan: Problem List Items Addressed This Visit    None      1) 1500 Calorie ADA Diet  2) Patient education given regarding appropriate lifestyle changes for weight loss including: regular physical activity, healthy coping strategies, caloric restriction and healthy eating patterns.  3) Patient will be started on weight loss medication. The risks and benefits and side effects of medication, such as Adipex (Phenteramine) ,  Tenuate (Diethylproprion), Belviq (lorcarsin), Contrave (  buproprion/naltrexone), Qsymia (phentermine/topiramate), and Saxenda (liraglutide) is discussed. The pros and cons of suppressing appetite and boosting metabolism is discussed. Risks of tolerence and addiction is discussed for selected agents discussed. Use of medicine will ne short term, such as 3-4 months at a time followed by a period of time off of the medicine to avoid these risks and side effects for Adipex, Qsymia, and Tenuate discussed.  Pt to call with any negative side effects and agrees to keep follow up appts.  4) Patient to take medication, with the benefits of appetite suppression and metabolism boost d/w pt, along with the side effects and risk factors of long term use that will be avoided with our use of short bursts of therapy. Rx provided.    5) 15 minutes face-to-face; with counseling/coordination of care > 50 percent of visit related to obesity and ongoing management/treatment   6) Follow up in 4 weeks to assess response

## 2017-04-19 DIAGNOSIS — M5413 Radiculopathy, cervicothoracic region: Secondary | ICD-10-CM | POA: Diagnosis not present

## 2017-04-19 DIAGNOSIS — M542 Cervicalgia: Secondary | ICD-10-CM | POA: Diagnosis not present

## 2017-04-19 DIAGNOSIS — M5414 Radiculopathy, thoracic region: Secondary | ICD-10-CM | POA: Diagnosis not present

## 2017-04-23 DIAGNOSIS — M5413 Radiculopathy, cervicothoracic region: Secondary | ICD-10-CM | POA: Diagnosis not present

## 2017-04-23 DIAGNOSIS — M542 Cervicalgia: Secondary | ICD-10-CM | POA: Diagnosis not present

## 2017-04-23 DIAGNOSIS — M5414 Radiculopathy, thoracic region: Secondary | ICD-10-CM | POA: Diagnosis not present

## 2017-04-25 ENCOUNTER — Ambulatory Visit (INDEPENDENT_AMBULATORY_CARE_PROVIDER_SITE_OTHER): Payer: 59 | Admitting: Obstetrics and Gynecology

## 2017-04-25 ENCOUNTER — Encounter: Payer: Self-pay | Admitting: Obstetrics and Gynecology

## 2017-04-25 DIAGNOSIS — M542 Cervicalgia: Secondary | ICD-10-CM | POA: Diagnosis not present

## 2017-04-25 DIAGNOSIS — M5413 Radiculopathy, cervicothoracic region: Secondary | ICD-10-CM | POA: Diagnosis not present

## 2017-04-25 DIAGNOSIS — M5414 Radiculopathy, thoracic region: Secondary | ICD-10-CM | POA: Diagnosis not present

## 2017-04-25 DIAGNOSIS — Z6841 Body Mass Index (BMI) 40.0 and over, adult: Secondary | ICD-10-CM

## 2017-04-25 MED ORDER — PHENTERMINE HCL 37.5 MG PO TABS: 37.5000 mg | ORAL_TABLET | Freq: Every day | ORAL | 0 refills | Status: DC

## 2017-04-25 NOTE — Progress Notes (Signed)
Gynecology Office Visit  Chief Complaint:  Chief Complaint  Patient presents with  . Weight Check    History of Present Illness: Patientis a 21 y.o. G0P0000 female, who presents for the evaluation of the desire to lose weight. She has gained 8 pounds (total weight loss 4 lbs net negative). The patient states the following symptoms since starting her weight loss therapy: appetite suppression, energy, and weight loss.  The patient also reports no other ill effects. The patient specifically denies heart palpitations, anxiety, and insomnia.    Review of Systems: 10 point review of systems negative unless otherwise noted in HPI  Past Medical History:  Past Medical History:  Diagnosis Date  . Ovarian cyst   . 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 COLON SURGERY     Exploratory surgery and colonscopy   . DIAGNOSTIC LAPAROSCOPY  05/22/2013  . INTRAUTERINE DEVICE (IUD) INSERTION  09/22/2016  . TONSILLECTOMY  at age 75    Gynecologic History: No LMP recorded. Patient is not currently having periods (Reason: IUD).  Obstetric History: G0P0000  Family History:  Family History  Problem Relation Age of Onset  . Kidney Stones Mother   . Polycystic ovary syndrome Mother   . Hypertension Father   . Hyperlipidemia Father   . Polycystic ovary syndrome Maternal Grandmother   . Colon cancer Maternal Grandfather 84    Social History:  Social History   Social History  . Marital status: Single    Spouse name: N/A  . Number of children: N/A  . Years of education: N/A   Occupational History  . Not on file.   Social History Main Topics  . Smoking status: Never Smoker  . Smokeless tobacco: Never Used  . Alcohol use Yes     Comment: occasionally  . Drug use: No  . Sexual activity: Yes   Other Topics Concern  . Not on file   Social History Narrative  . No  narrative on file    Allergies:  Allergies  Allergen Reactions  . Etodolac Hives and Swelling    Medications: Prior to Admission medications   Medication Sig Start Date End Date Taking? Authorizing Provider  levonorgestrel (MIRENA) 20 MCG/24HR IUD 1 each by Intrauterine route once.    [employer] [employer]  mupirocin ointment (BACTROBAN) 2 % APP EXT AA TID 02/28/17   [provider]  phentermine (ADIPEX-P) 37.5 MG tablet Take 1 tablet (37.5 mg total) by mouth daily before breakfast. 03/26/17   Vena Austria, MD    Physical Exam Filed Weights   04/25/17 1108  Weight: 296 lb (134.3 kg)   Body mass index is 43.71 kg/m.  General: NAD HEENT: normocephalic, anicteric Thyroid: no enlargement Pulmonary: no increased work of breathing Neurologic: Grossly intact Psychiatric: mood appropriate, affect full  Assessment: 21 y.o. G0P0000 No problem-specific Assessment & Plan notes found for this encounter.   Plan: Problem List Items Addressed This Visit    None    Visit Diagnoses    Morbid obesity (HCC)    -  Primary   Relevant Medications   phentermine (ADIPEX-P) 37.5 MG tablet   BMI 40.0-44.9, adult (HCC)       Relevant Medications   phentermine (ADIPEX-P) 37.5 MG tablet      1) 1500 Calorie ADA Diet  2) Patient education given regarding appropriate lifestyle changes for weight loss including:  regular physical activity, healthy coping strategies, caloric restriction and healthy eating patterns.  3) Patient will be started on weight loss medication. The risks and benefits and side effects of medication, such as Adipex (Phenteramine) ,  Tenuate (Diethylproprion), Belviq (lorcarsin), Contrave (buproprion/naltrexone), Qsymia (phentermine/topiramate), and Saxenda (liraglutide) is discussed. The pros and cons of suppressing appetite and boosting metabolism is discussed. Risks of tolerence and addiction is discussed for selected agents discussed. Use of medicine will ne  short term, such as 3-4 months at a time followed by a period of time off of the medicine to avoid these risks and side effects for Adipex, Qsymia, and Tenuate discussed. Pt to call with any negative side effects and agrees to keep follow up appts.  4) Patient to take medication, with the benefits of appetite suppression and metabolism boost d/w pt, along with the side effects and risk factors of long term use that will be avoided with our use of short bursts of therapy. Rx provided.  I question whether she really gained 8lbs or whether she lost only 2lbs last visit.  Discussed alternative if no weight loss this month   5) 15 minutes face-to-face; with counseling/coordination of care > 50 percent of visit related to obesity and ongoing management/treatment   6) Follow up in 12weeks to assess response

## 2017-05-01 DIAGNOSIS — M542 Cervicalgia: Secondary | ICD-10-CM | POA: Diagnosis not present

## 2017-05-01 DIAGNOSIS — M5414 Radiculopathy, thoracic region: Secondary | ICD-10-CM | POA: Diagnosis not present

## 2017-05-01 DIAGNOSIS — M5413 Radiculopathy, cervicothoracic region: Secondary | ICD-10-CM | POA: Diagnosis not present

## 2017-05-16 DIAGNOSIS — M542 Cervicalgia: Secondary | ICD-10-CM | POA: Diagnosis not present

## 2017-05-16 DIAGNOSIS — M5414 Radiculopathy, thoracic region: Secondary | ICD-10-CM | POA: Diagnosis not present

## 2017-05-16 DIAGNOSIS — M5413 Radiculopathy, cervicothoracic region: Secondary | ICD-10-CM | POA: Diagnosis not present

## 2017-05-21 DIAGNOSIS — M5413 Radiculopathy, cervicothoracic region: Secondary | ICD-10-CM | POA: Diagnosis not present

## 2017-05-21 DIAGNOSIS — M542 Cervicalgia: Secondary | ICD-10-CM | POA: Diagnosis not present

## 2017-05-21 DIAGNOSIS — M5414 Radiculopathy, thoracic region: Secondary | ICD-10-CM | POA: Diagnosis not present

## 2017-05-31 NOTE — Progress Notes (Signed)
Gynecology Office Visit  Chief Complaint:  Chief Complaint  Patient presents with  . Weight Check    History of Present Illness: Patientis a 21 y.o. G0P0000 female, who presents for the evaluation of the desire to lose weight. She has lost 1 pounds for a 3 month total weight loss of 5lbs. The patient states the following symptoms since starting her weight loss therapy: appetite suppression, energy, and weight loss.  The patient also reports no other ill effects. The patient specifically denies heart palpitations, anxiety, and insomnia.    Review of Systems: 10 point review of systems negative unless otherwise noted in HPI  Past Medical History:  Past Medical History:  Diagnosis Date  . Ovarian cyst   . 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 COLON SURGERY     Exploratory surgery and colonscopy   . DIAGNOSTIC LAPAROSCOPY  05/22/2013  . INTRAUTERINE DEVICE (IUD) INSERTION  09/22/2016  . TONSILLECTOMY  at age 51    Gynecologic History: No LMP recorded. Patient is not currently having periods (Reason: IUD).  Obstetric History: G0P0000  Family History:  Family History  Problem Relation Age of Onset  . Kidney Stones Mother   . Polycystic ovary syndrome Mother   . Hypertension Father   . Hyperlipidemia Father   . Polycystic ovary syndrome Maternal Grandmother   . Colon cancer Maternal Grandfather 26    Social History:  Social History   Social History  . Marital status: Single    Spouse name: N/A  . Number of children: N/A  . Years of education: N/A   Occupational History  . Not on file.   Social History Main Topics  . Smoking status: Never Smoker  . Smokeless tobacco: Never Used  . Alcohol use Yes     Comment: occasionally  . Drug use: No  . Sexual activity: Yes   Other Topics Concern  . Not on file   Social History Narrative  . No  narrative on file    Allergies:  Allergies  Allergen Reactions  . Etodolac Hives and Swelling    Medications: Prior to Admission medications   Medication Sig Start Date End Date Taking? Authorizing Provider  levonorgestrel (MIRENA) 20 MCG/24HR IUD 1 each by Intrauterine route once.    [employer], [employer]  mupirocin ointment (BACTROBAN) 2 % APP EXT AA TID 02/28/17   [provider]  phentermine (ADIPEX-P) 37.5 MG tablet Take 1 tablet (37.5 mg total) by mouth daily before breakfast. 04/25/17   Vena Austria, MD    Physical Exam Blood pressure (!) 134/96, pulse 80, height 5\' 9"  (1.753 m), weight 295 lb (133.8 kg). Prior weight 296lbs  General: NAD HEENT: normocephalic, anicteric Thyroid: no enlargement Pulmonary: no increased work of breathing Neurologic: Grossly intact Psychiatric: mood appropriate, affect full  Assessment: 21 y.o. G0P0000 No problem-specific Assessment & Plan notes found for this encounter.   Plan: Problem List Items Addressed This Visit      Other   Adiposity - Primary   Relevant Medications   Lorcaserin HCl ER (BELVIQ XR) 20 MG TB24      1) 1500 Calorie ADA Diet  2) Patient education given regarding appropriate lifestyle changes for weight loss including: regular physical activity, healthy coping strategies, caloric restriction and healthy eating patterns.  3) Patient will be started on weight loss medication. The risks and benefits and  side effects of medication, such as Adipex (Phenteramine) ,  Tenuate (Diethylproprion), Belviq (lorcarsin), Contrave (buproprion/naltrexone), Qsymia (phentermine/topiramate), and Saxenda (liraglutide) is discussed. The pros and cons of suppressing appetite and boosting metabolism is discussed. Risks of tolerence and addiction is discussed for selected agents discussed. Use of medicine will ne short term, such as 3-4 months at a time followed by a period of time off of the medicine to avoid these risks and  side effects for Adipex, Qsymia, and Tenuate discussed. Pt to call with any negative side effects and agrees to keep follow up appts.  4) Patient to take medication, with the benefits of appetite suppression and metabolism boost d/w pt, along with the side effects and risk factors of long term use that will be avoided with our use of short bursts of therapy. Rx provided Belviq   5) 15 minutes face-to-face; with counseling/coordination of care > 50 percent of visit related to obesity and ongoing management/treatment   6) Follow up in 4 weeks to assess response

## 2017-06-01 ENCOUNTER — Ambulatory Visit (INDEPENDENT_AMBULATORY_CARE_PROVIDER_SITE_OTHER): Payer: 59 | Admitting: Obstetrics and Gynecology

## 2017-06-01 ENCOUNTER — Encounter: Payer: Self-pay | Admitting: Obstetrics and Gynecology

## 2017-06-01 VITALS — BP 134/96 | HR 80 | Ht 69.0 in | Wt 295.0 lb

## 2017-06-01 DIAGNOSIS — IMO0001 Reserved for inherently not codable concepts without codable children: Secondary | ICD-10-CM

## 2017-06-01 DIAGNOSIS — Z6841 Body Mass Index (BMI) 40.0 and over, adult: Secondary | ICD-10-CM

## 2017-06-01 DIAGNOSIS — E669 Obesity, unspecified: Secondary | ICD-10-CM | POA: Diagnosis not present

## 2017-06-01 MED ORDER — LORCASERIN HCL ER 20 MG PO TB24: 1.0000 | ORAL_TABLET | Freq: Every day | ORAL | 2 refills | Status: DC

## 2017-06-07 ENCOUNTER — Encounter: Payer: Self-pay | Admitting: Obstetrics and Gynecology

## 2017-07-19 ENCOUNTER — Encounter: Payer: Self-pay | Admitting: Obstetrics and Gynecology

## 2017-07-23 ENCOUNTER — Other Ambulatory Visit: Payer: Self-pay | Admitting: Obstetrics and Gynecology

## 2017-07-23 MED ORDER — METFORMIN HCL 500 MG PO TABS: 500.0000 mg | ORAL_TABLET | Freq: Two times a day (BID) | ORAL | 11 refills | Status: DC

## 2017-07-26 DIAGNOSIS — M5413 Radiculopathy, cervicothoracic region: Secondary | ICD-10-CM | POA: Diagnosis not present

## 2017-07-26 DIAGNOSIS — M5414 Radiculopathy, thoracic region: Secondary | ICD-10-CM | POA: Diagnosis not present

## 2017-07-26 DIAGNOSIS — M542 Cervicalgia: Secondary | ICD-10-CM | POA: Diagnosis not present

## 2017-09-27 DIAGNOSIS — M5413 Radiculopathy, cervicothoracic region: Secondary | ICD-10-CM | POA: Diagnosis not present

## 2017-09-27 DIAGNOSIS — M5414 Radiculopathy, thoracic region: Secondary | ICD-10-CM | POA: Diagnosis not present

## 2017-09-27 DIAGNOSIS — M542 Cervicalgia: Secondary | ICD-10-CM | POA: Diagnosis not present

## 2017-11-13 ENCOUNTER — Encounter: Payer: Self-pay | Admitting: Obstetrics and Gynecology

## 2017-11-13 ENCOUNTER — Ambulatory Visit (INDEPENDENT_AMBULATORY_CARE_PROVIDER_SITE_OTHER): Payer: 59

## 2017-11-13 ENCOUNTER — Ambulatory Visit: Payer: 59 | Admitting: Obstetrics and Gynecology

## 2017-11-13 VITALS — BP 140/80 | HR 83 | Ht 69.0 in | Wt 309.0 lb

## 2017-11-13 DIAGNOSIS — R102 Pelvic and perineal pain: Secondary | ICD-10-CM

## 2017-11-13 DIAGNOSIS — Z30431 Encounter for routine checking of intrauterine contraceptive device: Secondary | ICD-10-CM | POA: Diagnosis not present

## 2017-11-13 NOTE — Patient Instructions (Signed)
I value your feedback and entrusting us with your care. If you get a [facility] patient survey, I would appreciate you taking the time to let us know about your experience today. Thank you! 
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
## 2017-11-28 DIAGNOSIS — M5413 Radiculopathy, cervicothoracic region: Secondary | ICD-10-CM | POA: Diagnosis not present

## 2017-11-28 DIAGNOSIS — M542 Cervicalgia: Secondary | ICD-10-CM | POA: Diagnosis not present

## 2017-11-28 DIAGNOSIS — M5414 Radiculopathy, thoracic region: Secondary | ICD-10-CM | POA: Diagnosis not present

## 2017-11-30 DIAGNOSIS — M5413 Radiculopathy, cervicothoracic region: Secondary | ICD-10-CM | POA: Diagnosis not present

## 2017-11-30 DIAGNOSIS — M542 Cervicalgia: Secondary | ICD-10-CM | POA: Diagnosis not present

## 2017-11-30 DIAGNOSIS — M5414 Radiculopathy, thoracic region: Secondary | ICD-10-CM | POA: Diagnosis not present

## 2017-12-26 DIAGNOSIS — R6889 Other general symptoms and signs: Secondary | ICD-10-CM | POA: Diagnosis not present

## 2017-12-26 DIAGNOSIS — H66002 Acute suppurative otitis media without spontaneous rupture of ear drum, left ear: Secondary | ICD-10-CM | POA: Diagnosis not present

## 2017-12-26 DIAGNOSIS — J101 Influenza due to other identified influenza virus with other respiratory manifestations: Secondary | ICD-10-CM | POA: Diagnosis not present

## 2018-02-05 ENCOUNTER — Encounter: Payer: Self-pay | Admitting: Obstetrics and Gynecology

## 2018-03-08 ENCOUNTER — Encounter: Payer: Self-pay | Admitting: Obstetrics & Gynecology

## 2018-03-08 ENCOUNTER — Ambulatory Visit: Payer: 59 | Admitting: Obstetrics & Gynecology

## 2018-03-08 VITALS — BP 130/90 | HR 79 | Ht 69.0 in | Wt 314.0 lb

## 2018-03-08 DIAGNOSIS — L732 Hidradenitis suppurativa: Secondary | ICD-10-CM

## 2018-03-08 DIAGNOSIS — Z6841 Body Mass Index (BMI) 40.0 and over, adult: Secondary | ICD-10-CM | POA: Insufficient documentation

## 2018-03-08 MED ORDER — CEPHALEXIN 500 MG PO CAPS
500.0000 mg | ORAL_CAPSULE | Freq: Four times a day (QID) | ORAL | 2 refills | Status: DC
Start: 2018-03-08 — End: 2019-05-07

## 2018-03-08 NOTE — Patient Instructions (Signed)
Hidradenitis Suppurativa Hidradenitis suppurativa is a long-term (chronic) skin disease that starts with blocked sweat glands or hair follicles. Bacteria may grow in these blocked openings of your skin. Hidradenitis suppurativa is like a severe form of acne that develops in areas of your body where acne would be unusual. It is most likely to affect the areas of your body where skin rubs against skin and becomes moist. This includes your:  Underarms.  Groin.  Genital areas.  Buttocks.  Upper thighs.  Breasts.  Hidradenitis suppurativa may start out with small pimples. The pimples can develop into deep sores that break open (rupture) and drain pus. Over time your skin may thicken and become scarred. Hidradenitis suppurativa cannot be passed from person to person. What are the causes? The exact cause of hidradenitis suppurativa is not known. This condition may be due to:  Female and female hormones. The condition is rare before and after puberty.  An overactive body defense system (immune system). Your immune system may overreact to the blocked hair follicles or sweat glands and cause swelling and pus-filled sores.  What increases the risk? You may have a higher risk of hidradenitis suppurativa if you:  Are a woman.  Are between ages 11 and 55.  Have a family history of hidradenitis suppurativa.  Have a personal history of acne.  Are overweight.  Smoke.  Take the drug lithium.  What are the signs or symptoms? The first signs of an outbreak are usually painful skin bumps that look like pimples. As the condition progresses:  Skin bumps may get bigger and grow deeper into the skin.  Bumps under the skin may rupture and drain smelly pus.  Skin may become itchy and infected.  Skin may thicken and scar.  Drainage may continue through tunnels under the skin (fistulas).  Walking and moving your arms can become painful.  How is this diagnosed? Your health care provider may  diagnose hidradenitis suppurativa based on your medical history and your signs and symptoms. A physical exam will also be done. You may need to see a health care provider who specializes in skin diseases (dermatologist). You may also have tests done to confirm the diagnosis. These can include:  Swabbing a sample of pus or drainage from your skin so it can be sent to the lab and tested for infection.  Blood tests to check for infection.  How is this treated? The same treatment will not work for everybody with hidradenitis suppurativa. Your treatment will depend on how severe your symptoms are. You may need to try several treatments to find what works best for you. Part of your treatment may include cleaning and bandaging (dressing) your wounds. You may also have to take medicines, such as the following:  Antibiotics.  Acne medicines.  Medicines to block or suppress the immune system.  A diabetes medicine (metformin) is sometimes used to treat this condition.  For women, birth control pills can sometimes help relieve symptoms.  You may need surgery if you have a severe case of hidradenitis suppurativa that does not respond to medicine. Surgery may involve:  Using a laser to clear the skin and remove hair follicles.  Opening and draining deep sores.  Removing the areas of skin that are diseased and scarred.  Follow these instructions at home:  Learn as much as you can about your disease, and work closely with your health care providers.  Take medicines only as directed by your health care provider.  If you were prescribed   an antibiotic medicine, finish it all even if you start to feel better.  If you are overweight, losing weight may be very helpful. Try to reach and maintain a healthy weight.  Do not use any tobacco products, including cigarettes, chewing tobacco, or electronic cigarettes. If you need help quitting, ask your health care provider.  Do not shave the areas where you  get hidradenitis suppurativa.  Do not wear deodorant.  Wear loose-fitting clothes.  Try not to overheat and get sweaty.  Take a daily bleach bath as directed by your health care provider. ? Fill your bathtub halfway with water. ? Pour in  cup of unscented household bleach. ? Soak for 5-10 minutes.  Cover sore areas with a warm, clean washcloth (compress) for 5-10 minutes. Contact a health care provider if:  You have a flare-up of hidradenitis suppurativa.  You have chills or a fever.  You are having trouble controlling your symptoms at home. This information is not intended to replace advice given to you by your health care provider. Make sure you discuss any questions you have with your health care provider. Document Released: 05/09/2004 Document Revised: 03/02/2016 Document Reviewed: 12/26/2013 Elsevier Interactive Patient Education  2018 [employer]  

## 2018-03-08 NOTE — Progress Notes (Signed)
HPI:      [name] is a 22 y.o. G0P0000, No LMP recorded. (Menstrual status: IUD)., presents today for a problem visit.  She complains of:  Vulvar concern:   This is a 22 y.o. old Caucasian/White female who presents for the evaluation of  Thigh and labial and mons lesions that occur sporadically, more so this week.  These are painful boils that form and sometimes drain.  Prior tx w ABX helped.  Tried other habits and products (not shaving, etx) without help.  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 Also,  has a past surgical history that includes Colon surgery; Tonsillectomy (at age 57); Diagnostic laparoscopy (05/22/2013); and Intrauterine device (iud) insertion (09/22/2016)., family history includes Colon cancer (age of onset: 20) in her maternal grandfather; Hyperlipidemia in her father; Hypertension in her father; Kidney Stones in her mother; Polycystic ovary syndrome in her maternal grandmother and mother.,  reports that she has never smoked. She has never used smokeless tobacco. She reports that she drinks alcohol. She reports that she does not use drugs.  She has a current medication list which includes the following prescription(s): cephalexin, levonorgestrel, lorcaserin hcl er, metformin, and mupirocin ointment. Also, is allergic to etodolac.  Review of Systems  Constitutional: Negative for chills, fever and malaise/fatigue.  HENT: Negative for congestion, sinus pain and sore throat.   Eyes: Negative for blurred vision and pain.  Respiratory: Negative for cough and wheezing.   Cardiovascular: Negative for chest pain and leg swelling.  Gastrointestinal: Negative for abdominal pain, constipation, diarrhea, heartburn, nausea and vomiting.  Genitourinary: Negative for dysuria, frequency, hematuria and urgency.  Musculoskeletal: Negative for back pain, joint pain, myalgias and neck pain.  Skin: Negative for itching and rash.    Neurological: Negative for dizziness, tremors and weakness.  Endo/Heme/Allergies: Does not bruise/bleed easily.  Psychiatric/Behavioral: Negative for depression. The patient is not nervous/anxious and does not have insomnia.    Objective: BP 130/90   Pulse 79   Ht  (1.753 m)   Wt (!) 314 lb (142.4 kg)   BMI 46.37 kg/m  Physical Exam  Constitutional: She is oriented to person, place, and time. She appears well-developed and well-nourished. No distress.  Genitourinary: Vagina normal and uterus normal. Pelvic exam was performed with patient supine. There is no rash, tenderness or lesion on the right labia. There is no rash, tenderness or lesion on the left labia. No erythema or bleeding in the vagina. Right adnexum does not display mass and does not display tenderness. Left adnexum does not display mass and does not display tenderness. Cervix does not exhibit motion tenderness, discharge, polyp or nabothian cyst.   Uterus is mobile and midaxial. Uterus is not enlarged or exhibiting a mass.  Genitourinary Comments: Lesions c/w hidradenitis on inner thigh, buttocks area, and mons  HENT:  Head: Normocephalic and atraumatic.  Nose: Nose normal.  Mouth/Throat: Oropharynx is clear and moist.  Abdominal: Soft. She exhibits no distension. There is no tenderness.  Musculoskeletal: Normal range of motion.  Neurological: She is alert and oriented to person, place, and time. No cranial nerve deficit.  Skin: Skin is warm and dry.  Psychiatric: She has a normal mood and affect.   ASSESSMENT/PLAN:    Problem List Items Addressed This Visit      Musculoskeletal and Integument   Hidradenitis suppurativa - Primary   Relevant Medications   cephALEXin (KEFLEX) 500 MG capsule  Warm soaks, hygiene, and ABX discussed. Monitor recurrence  Annamarie Major, MD, [facility], [facility] 03/08/2018  2:31 PM

## 2018-03-13 ENCOUNTER — Encounter: Payer: Self-pay | Admitting: Obstetrics & Gynecology

## 2018-03-14 ENCOUNTER — Other Ambulatory Visit: Payer: Self-pay | Admitting: Obstetrics and Gynecology

## 2018-03-14 ENCOUNTER — Encounter: Payer: Self-pay | Admitting: Obstetrics and Gynecology

## 2018-03-14 MED ORDER — FLUCONAZOLE 150 MG PO TABS: 150.0000 mg | ORAL_TABLET | Freq: Once | ORAL | Status: DC

## 2018-03-14 MED ORDER — FLUCONAZOLE 150 MG PO TABS: 150.0000 mg | ORAL_TABLET | Freq: Once | ORAL | 0 refills | Status: AC

## 2018-03-25 DIAGNOSIS — R21 Rash and other nonspecific skin eruption: Secondary | ICD-10-CM | POA: Diagnosis not present

## 2018-03-25 DIAGNOSIS — R300 Dysuria: Secondary | ICD-10-CM | POA: Diagnosis not present

## 2018-03-28 DIAGNOSIS — M5414 Radiculopathy, thoracic region: Secondary | ICD-10-CM | POA: Diagnosis not present

## 2018-03-28 DIAGNOSIS — M542 Cervicalgia: Secondary | ICD-10-CM | POA: Diagnosis not present

## 2018-03-28 DIAGNOSIS — M5413 Radiculopathy, cervicothoracic region: Secondary | ICD-10-CM | POA: Diagnosis not present

## 2018-03-29 DIAGNOSIS — M542 Cervicalgia: Secondary | ICD-10-CM | POA: Diagnosis not present

## 2018-03-29 DIAGNOSIS — M5414 Radiculopathy, thoracic region: Secondary | ICD-10-CM | POA: Diagnosis not present

## 2018-03-29 DIAGNOSIS — M5413 Radiculopathy, cervicothoracic region: Secondary | ICD-10-CM | POA: Diagnosis not present

## 2018-06-05 DIAGNOSIS — M542 Cervicalgia: Secondary | ICD-10-CM | POA: Diagnosis not present

## 2018-06-05 DIAGNOSIS — M5414 Radiculopathy, thoracic region: Secondary | ICD-10-CM | POA: Diagnosis not present

## 2018-06-05 DIAGNOSIS — M5413 Radiculopathy, cervicothoracic region: Secondary | ICD-10-CM | POA: Diagnosis not present

## 2018-12-10 DIAGNOSIS — J069 Acute upper respiratory infection, unspecified: Secondary | ICD-10-CM | POA: Diagnosis not present

## 2018-12-10 DIAGNOSIS — R51 Headache: Secondary | ICD-10-CM | POA: Diagnosis not present

## 2018-12-10 DIAGNOSIS — R52 Pain, unspecified: Secondary | ICD-10-CM | POA: Diagnosis not present

## 2019-05-07 ENCOUNTER — Other Ambulatory Visit: Payer: Self-pay

## 2019-05-07 ENCOUNTER — Other Ambulatory Visit (HOSPITAL_COMMUNITY)
Admission: RE | Admit: 2019-05-07 | Discharge: 2019-05-07 | Disposition: A | Discharge status: Home / Self Care | Payer: 59 | Source: Ambulatory Visit | Attending: Obstetrics and Gynecology | Admitting: Obstetrics and Gynecology

## 2019-05-07 ENCOUNTER — Encounter: Payer: Self-pay | Admitting: Obstetrics and Gynecology

## 2019-05-07 ENCOUNTER — Ambulatory Visit: Payer: 59 | Admitting: Obstetrics and Gynecology

## 2019-05-07 VITALS — BP 149/95 | HR 72 | Ht 69.0 in | Wt 315.0 lb

## 2019-05-07 DIAGNOSIS — Z30431 Encounter for routine checking of intrauterine contraceptive device: Secondary | ICD-10-CM | POA: Diagnosis not present

## 2019-05-07 DIAGNOSIS — Z124 Encounter for screening for malignant neoplasm of cervix: Secondary | ICD-10-CM | POA: Insufficient documentation

## 2019-05-07 DIAGNOSIS — Z113 Encounter for screening for infections with a predominantly sexual mode of transmission: Secondary | ICD-10-CM

## 2019-05-07 DIAGNOSIS — R102 Pelvic and perineal pain: Secondary | ICD-10-CM

## 2019-05-07 DIAGNOSIS — G8929 Other chronic pain: Secondary | ICD-10-CM

## 2019-05-07 NOTE — Progress Notes (Signed)
Obstetrics & Gynecology Office Visit   Chief Complaint:  Chief Complaint  Patient presents with  . Follow-up    IUD check, Pelvic pain h/o endometriosis  . Breast Pain    tenderness x 3 weeks    History of Present Illness: 23 y.o. patient presenting for follow up of Mirena IUD placement 3 years ago.  The indication for her IUD was endometriosis.  She denies any complications since her IUD placement.  Still having some occasional spotting.  For past 3 week has noted acute exacerbation in pelvic pain and cramping.    Also noted left sided breast tenderness.  No nipple discharge, skin changes, or masses noted.    Review of Systems: Review of Systems  Constitutional: Negative.   Gastrointestinal: Positive for abdominal pain. Negative for nausea and vomiting.  Genitourinary: Negative.   Skin: Negative.     Past Medical History:  Past Medical History:  Diagnosis Date  . Ovarian cyst   . 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 COLON SURGERY     Exploratory surgery and colonscopy   . DIAGNOSTIC LAPAROSCOPY  05/22/2013  . INTRAUTERINE DEVICE (IUD) INSERTION  09/22/2016  . TONSILLECTOMY  at age 23    Gynecologic History: No LMP recorded. (Menstrual status: IUD).  Obstetric History: G0P0000  Family History:  Family History  Problem Relation Age of Onset  . Kidney Stones Mother   . Polycystic ovary syndrome Mother   . Hypertension Father   . Hyperlipidemia Father   . Polycystic ovary syndrome Maternal Grandmother   . Colon cancer Maternal Grandfather [phone]    Social History:  Social History   Socioeconomic History  . Marital status: Single    Spouse name: Not on file  . Number of children: Not on file  . Years of education: Not on file  . Highest education level: Not on file  Occupational History  . Not on file  Social Needs  . Financial  resource strain: Not on file  . Food insecurity    Worry: Not on file    Inability: Not on file  . Transportation needs    Medical: Not on file    Non-medical: Not on file  Tobacco Use  . Smoking status: Never Smoker  . Smokeless tobacco: Never Used  Substance and Sexual Activity  . Alcohol use: Yes    Comment: occasionally  . Drug use: No  . Sexual activity: Yes    Birth control/protection: I.U.D.  Lifestyle  . Physical activity    Days per week: Not on file    Minutes per session: Not on file  . Stress: Not on file  Relationships  . Social Musicianconnections    Talks on phone: Not on file    Gets together: Not on file    Attends religious service: Not on file    Active member of club or organization: Not on file    Attends meetings of clubs or organizations: Not on file    Relationship status: Not on file  . Intimate partner violence    Fear of current or ex partner: Not on file    Emotionally abused: Not on file    Physically abused: Not on file    Forced sexual activity: Not on file  Other Topics Concern  . Not on file  Social History Narrative  . Not on file  Allergies:  Allergies  Allergen Reactions  . Etodolac Hives and Swelling    Medications: Prior to Admission medications   Medication Sig Start Date End Date Taking? Authorizing Provider  levonorgestrel (MIRENA) 20 MCG/24HR IUD 1 each by Intrauterine route once.   Yes Physicians, [employer] Faculty    Physical Exam Blood pressure (!) 149/95, pulse 72, height 5\' 9"  (1.753 m), weight (!) 315 lb (142.9 kg). No LMP recorded. (Menstrual status: IUD).  General: NAD HEENT: normocephalic, anicteric Pulmonary: No increased work of breathing  Genitourinary:  External: Normal external female genitalia.  Normal urethral meatus, normal  Bartholin's and Skene's glands.    Vagina: Normal vaginal mucosa, no evidence of prolapse.    Cervix: Grossly normal in appearance, no bleeding, IUD strings visualized 2cm  Uterus:  Non-enlarged, mobile, normal contour.  No CMT  Adnexa: ovaries non-enlarged, no adnexal masses  Rectal: deferred  Lymphatic: no evidence of inguinal lymphadenopathy Extremities: no edema, erythema, or tenderness Neurologic: Grossly intact Psychiatric: mood appropriate, affect full  Female chaperone present for pelvic and breast  portions of the physical exam  Assessment: 23 y.o. G0P0000 IUD check, pelvic pain  Plan: Problem List Items Addressed This Visit    None    Visit Diagnoses    IUD check up    -  Primary   Relevant Orders   US Transvaginal Non-OB   Chronic pelvic pain in female       Relevant Orders   US Transvaginal Non-OB   Screening for malignant neoplasm of cervix       Relevant Orders   Cytology - PAP   Routine screening for STI (sexually transmitted infection)       Relevant Orders   Cytology - PAP       1.  The patient was given instructions to check her IUD strings monthly and call with any problems or concerns.  She should call for fevers, chills, abnormal vaginal discharge, pelvic pain, or other complaints.  2.   IUDs while effective at preventing pregnancy do not prevent transmission of sexually transmitted diseases and use of barrier methods for this purpose was discussed.  Low overall incidence of failure with 99.7% efficacy rate in typical use.  The patient has not contraindication to IUD placement.  3.  Given cramping will arrange for TVUS to evaluate IUD location and adnexa.  Also discussed trail of Orilissa for presumptive endometriosis in setting of chronic pelvic pain.    4) Health care maintenance - pap smear obtained, GC/CT cultures with pap given pelvic pain  5) A total of 15 minutes were spent in face-to-face contact with the patient during this encounter with over half of that time devoted to counseling and coordination of care.  6) Return in about 1 week (around 05/14/2019) for 1-2 weeks TVUS and follow up.   Malachy Mood, MD, [facility], [facility] 05/07/2019, 10:19 AM

## 2019-05-12 LAB — CYTOLOGY - PAP
Chlamydia: NEGATIVE
Neisseria Gonorrhea: NEGATIVE

## 2019-05-13 ENCOUNTER — Other Ambulatory Visit: Payer: Self-pay | Admitting: Obstetrics and Gynecology

## 2019-05-13 MED ORDER — VALACYCLOVIR HCL 500 MG PO TABS: 500.0000 mg | ORAL_TABLET | Freq: Two times a day (BID) | ORAL | 6 refills | Status: AC

## 2019-05-20 ENCOUNTER — Other Ambulatory Visit: Payer: 59

## 2019-05-20 ENCOUNTER — Ambulatory Visit: Payer: 59 | Admitting: Obstetrics and Gynecology

## 2019-05-26 ENCOUNTER — Other Ambulatory Visit: Payer: Self-pay

## 2019-05-26 ENCOUNTER — Ambulatory Visit (INDEPENDENT_AMBULATORY_CARE_PROVIDER_SITE_OTHER): Payer: 59

## 2019-05-26 ENCOUNTER — Ambulatory Visit (INDEPENDENT_AMBULATORY_CARE_PROVIDER_SITE_OTHER): Payer: 59 | Admitting: Obstetrics and Gynecology

## 2019-05-26 ENCOUNTER — Encounter: Payer: Self-pay | Admitting: Obstetrics and Gynecology

## 2019-05-26 VITALS — BP 128/88 | HR 60 | Ht 69.0 in | Wt 310.0 lb

## 2019-05-26 DIAGNOSIS — R102 Pelvic and perineal pain: Secondary | ICD-10-CM | POA: Diagnosis not present

## 2019-05-26 DIAGNOSIS — Z30431 Encounter for routine checking of intrauterine contraceptive device: Secondary | ICD-10-CM | POA: Diagnosis not present

## 2019-05-26 DIAGNOSIS — N809 Endometriosis, unspecified: Secondary | ICD-10-CM

## 2019-05-26 DIAGNOSIS — G8929 Other chronic pain: Secondary | ICD-10-CM

## 2019-05-26 MED ORDER — ORILISSA 150 MG PO TABS: 1.0000 | ORAL_TABLET | Freq: Every day | ORAL | 5 refills | Status: DC

## 2019-05-26 NOTE — Progress Notes (Signed)
Gynecology Ultrasound Follow Up  Chief Complaint:  Chief Complaint  Patient presents with  . Follow-up    GYN U/S     History of Present Illness: Patient is a 23 y.o. female who presents today for ultrasound evaluation of pelvic pain.  Ultrasound demonstrates the following findgins Adnexa: no masses seen Uterus: Non-enlarged, homogenous echo texture, no fibroids with endometrial stripe appropriately thin in setting of IUD at 6.18mm Additional: IUD in proper position and deployment within endometrial cavity  Review of Systems: Review of Systems  Constitutional: Negative.   Gastrointestinal: Negative for abdominal pain.  Genitourinary: Negative.     Past Medical History:  Past Medical History:  Diagnosis Date  . Ovarian cyst   . 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 COLON SURGERY     Exploratory surgery and colonscopy   . DIAGNOSTIC LAPAROSCOPY  05/22/2013  . INTRAUTERINE DEVICE (IUD) INSERTION  09/22/2016  . TONSILLECTOMY  at age 37    Gynecologic History:  No LMP recorded. (Menstrual status: IUD). Contraception: IUD Last Pap: LGSIL 05/07/2019  Family History:  Family History  Problem Relation Age of Onset  . Kidney Stones Mother   . Polycystic ovary syndrome Mother   . Hypertension Father   . Hyperlipidemia Father   . Polycystic ovary syndrome Maternal Grandmother   . Colon cancer Maternal Grandfather 28    Social History:  Social History   Socioeconomic History  . Marital status: Single    Spouse name: Not on file  . Number of children: Not on file  . Years of education: Not on file  . Highest education level: Not on file  Occupational History  . Not on file  Social Needs  . Financial resource strain: Not on file  . Food insecurity    Worry: Not on file    Inability: Not on file  . Transportation needs    Medical: Not on file     Non-medical: Not on file  Tobacco Use  . Smoking status: Never Smoker  . Smokeless tobacco: Never Used  Substance and Sexual Activity  . Alcohol use: Yes    Comment: occasionally  . Drug use: No  . Sexual activity: Yes    Birth control/protection: I.U.D.  Lifestyle  . Physical activity    Days per week: Not on file    Minutes per session: Not on file  . Stress: Not on file  Relationships  . Social Herbalist on phone: Not on file    Gets together: Not on file    Attends religious service: Not on file    Active member of club or organization: Not on file    Attends meetings of clubs or organizations: Not on file    Relationship status: Not on file  . Intimate partner violence    Fear of current or ex partner: Not on file    Emotionally abused: Not on file    Physically abused: Not on file    Forced sexual activity: Not on file  Other Topics Concern  . Not on file  Social History Narrative  . Not on file    Allergies:  Allergies  Allergen Reactions  . Etodolac Hives and Swelling    Medications: Prior to Admission medications   Medication Sig Start Date End Date Taking? Authorizing Provider  levonorgestrel (MIRENA) 20 MCG/24HR IUD 1 each  by Intrauterine route once.   Yes Physicians, [employer]  valACYclovir (VALTREX) 500 MG tablet  05/23/19  Yes [provider]    Physical Exam Vitals: Blood pressure 128/88, pulse 60, height 5\' 9"  (1.753 m), weight (!) 310 lb (140.6 kg).  General: NAD HEENT: normocephalic, anicteric Pulmonary: No increased work of breathing Extremities: no edema, erythema, or tenderness Neurologic: Grossly intact, normal gait Psychiatric: mood appropriate, affect full  [place] Transvaginal Non-ob  Result Date: 05/26/2019 Patient Name: Ashley Kempfmily J Ganus DOB: August 21, 1996 MRN: [id number] ULTRASOUND REPORT Location: [facility] Date of Service: 05/26/2019 Indications:Pelvic Pain Findings: The uterus is anteverted and measures 6.1 x  3.6 x 2.9 cm. Echo texture is homogenous without evidence of focal masses. The Endometrium measures 6.3 mm. Right Ovary measures 3.2 x 2.1 x 2.8 cm. It is normal in appearance. Left Ovary measures 3.2 x 3.0 x 1.7 cm. It is normal in appearance. Survey of the adnexa demonstrates no adnexal masses. There is no free fluid in the cul de sac. Impression: 1. Normal pelvic ultrasound. 2. The IUD is correctly located within the uterus. Recommendations: 1.Clinical correlation with the patient's History and Physical Exam. Ashley Yates, RT Images reviewed.  Normal GYN study without visualized pathology.   IUD properly deployed within the endometrial cavity Vena AustriaAndreas Jarrod Mcenery, MD, [facility]AC[facility], [facility] 05/26/2019, 8:35 AM     Assessment: 23 y.o. G0P0000 with chronic pelvic pain  Plan: Problem List Items Addressed This Visit    None    Visit Diagnoses    Chronic pelvic pain in female    -  Primary      1) Pelvic pain - Normal US today. No evidence of ovarian cysts or mass.   IUD in proper location.   Prior diagnostic laparoscopy negative for endoemtriosis but showing incidental salpingitis isthmica nodosum of the right fallopian tube.   - Given dyspareunia and given now 6 years since diagnostic laparoscopy start trial of orilissa  2) A total of 15 minutes were spent in face-to-face contact with the patient during this encounter with over half of that time devoted to counseling and coordination of care.  3) Return in about 6 weeks (around 07/07/2019) for Medication Follow up .    Vena AustriaAndreas Corine Solorio, MD, Evern CoreFACOG [facility], [facility] 05/26/2019, 9:12 AM

## 2019-06-02 ENCOUNTER — Other Ambulatory Visit: Payer: Self-pay | Admitting: Obstetrics and Gynecology

## 2019-06-02 MED ORDER — METRONIDAZOLE 500 MG PO TABS: 500.0000 mg | ORAL_TABLET | Freq: Two times a day (BID) | ORAL | 0 refills | Status: AC

## 2019-07-08 ENCOUNTER — Ambulatory Visit: Payer: 59 | Admitting: Obstetrics and Gynecology

## 2019-07-22 ENCOUNTER — Other Ambulatory Visit: Payer: Self-pay | Admitting: Obstetrics and Gynecology

## 2019-07-22 ENCOUNTER — Encounter: Payer: Self-pay | Admitting: Obstetrics and Gynecology

## 2019-07-22 ENCOUNTER — Ambulatory Visit (INDEPENDENT_AMBULATORY_CARE_PROVIDER_SITE_OTHER): Payer: 59 | Admitting: Obstetrics and Gynecology

## 2019-07-22 ENCOUNTER — Other Ambulatory Visit: Payer: Self-pay

## 2019-07-22 DIAGNOSIS — G8929 Other chronic pain: Secondary | ICD-10-CM | POA: Diagnosis not present

## 2019-07-22 DIAGNOSIS — R102 Pelvic and perineal pain: Secondary | ICD-10-CM

## 2019-07-22 MED ORDER — ORILISSA 150 MG PO TABS: 1.0000 | ORAL_TABLET | Freq: Every day | ORAL | 11 refills | Status: DC

## 2019-07-22 NOTE — Progress Notes (Signed)
I connected with Ashley Yates on 07/22/19 at  8:10 AM EDT by telephone and verified that I am speaking with the correct person using two identifiers.   I discussed the limitations, risks, security and privacy concerns of performing an evaluation and management service by telephone and the availability of in person appointments. I also discussed with the patient that there may be a patient responsible charge related to this service. The patient expressed understanding and agreed to proceed.  The patient was at home I spoke with the patient from my workstation phone The names of people involved in this encounter were: Ashley Yates , and [facility] Office Visit   Chief Complaint:  Chief Complaint  Patient presents with  . Follow-up    Felling better on medication had a few episodes of pain     History of Present Illness: 23 y.o. G0P0000 presenting for medication follow up for a diagnosis of pelvic pain, suspected component of endometriosis.  She is currently being managed with Orilissa 150mg .   The patient reports good control of symptoms on her current regimen.  On her current medication regimen the patient has achieved amenorrhea.  One episode of breakthrough bleeding (has IUD). She has not noted any side-effects or new symptoms besides some hot flashes which have been improving..    Review of Systems: Review of Systems  Constitutional: Negative.   Gastrointestinal: Negative.   Psychiatric/Behavioral: Negative.      Past Medical History:  Past Medical History:  Diagnosis Date  . Ovarian cyst   . 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 COLON SURGERY     Exploratory surgery and colonscopy   . DIAGNOSTIC LAPAROSCOPY  05/22/2013  . INTRAUTERINE DEVICE (IUD) INSERTION  09/22/2016  . TONSILLECTOMY  at age 65    Gynecologic  History: No LMP recorded. (Menstrual status: IUD).  Obstetric History: G0P0000  Family History:  Family History  Problem Relation Age of Onset  . Kidney Stones Mother   . Polycystic ovary syndrome Mother   . Hypertension Father   . Hyperlipidemia Father   . Polycystic ovary syndrome Maternal Grandmother   . Colon cancer Maternal Grandfather 82    Social History:  Social History   Socioeconomic History  . Marital status: Single    Spouse name: Not on file  . Number of children: Not on file  . Years of education: Not on file  . Highest education level: Not on file  Occupational History  . Not on file  Social Needs  . Financial resource strain: Not on file  . Food insecurity    Worry: Not on file    Inability: Not on file  . Transportation needs    Medical: Not on file    Non-medical: Not on file  Tobacco Use  . Smoking status: Never Smoker  . Smokeless tobacco: Never Used  Substance and Sexual Activity  . Alcohol use: Yes    Comment: occasionally  . Drug use: No  . Sexual activity: Yes    Birth control/protection: I.U.D.  Lifestyle  . Physical activity    Days per week: Not on file    Minutes per session: Not on file  . Stress: Not on file  Relationships  . Social Herbalist on phone: Not on file    Gets together: Not on  file    Attends religious service: Not on file    Active member of club or organization: Not on file    Attends meetings of clubs or organizations: Not on file    Relationship status: Not on file  . Intimate partner violence    Fear of current or ex partner: Not on file    Emotionally abused: Not on file    Physically abused: Not on file    Forced sexual activity: Not on file  Other Topics Concern  . Not on file  Social History Narrative  . Not on file    Allergies:  Allergies  Allergen Reactions  . Etodolac Hives and Swelling    Medications: Prior to Admission medications   Medication Sig Start Date End Date Taking?  Authorizing Provider  Elagolix Sodium (ORILISSA) 150 MG TABS Take 1 tablet by mouth daily. 05/26/19  Yes Vena Austria, MD  levonorgestrel (MIRENA) 20 MCG/24HR IUD 1 each by Intrauterine route once.   Yes Physicians, [employer]  valACYclovir (VALTREX) 500 MG tablet  05/23/19  Yes [provider]    Physical Exam Vitals: There were no vitals filed for this visit. No LMP recorded. (Menstrual status: IUD).  No physical exam as this was a remote telephone visit to promote social distancing during the current COVID-19 Pandemic   Assessment: 23 y.o. G0P0000   Plan: Problem List Items Addressed This Visit    None    Visit Diagnoses    Chronic pelvic pain in female    -  Primary     1) Pelvic pain - good improvement on Orilissa.  Has noted both decrease in number and duration in pelvic pain episodes.    2) Telephone time 5:16 minutes  3) Return in about 3 months (around 10/22/2019) for phone or in office medication follow up.   Vena Austria, MD, Evern Core [facility], [facility] 07/22/2019, 8:21 AM

## 2019-08-05 ENCOUNTER — Other Ambulatory Visit: Payer: Self-pay

## 2019-08-05 ENCOUNTER — Other Ambulatory Visit: Payer: Self-pay | Admitting: Obstetrics and Gynecology

## 2019-08-05 MED ORDER — VALACYCLOVIR HCL 500 MG PO TABS: 500.0000 mg | ORAL_TABLET | Freq: Two times a day (BID) | ORAL | 6 refills | Status: AC

## 2019-10-16 NOTE — Patient Instructions (Signed)
I value your feedback and entrusting us with your care. If you get a [facility] patient survey, I would appreciate you taking the time to let us know about your experience today. Thank you!  As of September 18, 2019, your lab results will be released to your MyChart immediately, before I even have a chance to see them. Please give me time to review them and contact you if there are any abnormalities. Thank you for your patience.  

## 2019-10-16 NOTE — Progress Notes (Signed)
   Chief Complaint  Patient presents with  . Contraception    IUD Removal d/t irregular bleeding     History of Present Illness:  Ashley Yates is a 24 y.o. that had a Mirena IUD placed approximately 3 1/2  years ago. Since that time, she had been amenorrheic until the past 3 months. She is now having spotting/light bleeding every other wk. Hx of endometriosis/CPP and menorrhagia. IUD helped with bleeding. No relief with Dewayne Hatch so stopped that. Wants IUD removed and to just be off everything for a few months.  She is not sex active. Followed by Dr. Bonney Aid.  BP 138/78 (BP Location: Left Arm, Patient Position: Sitting, Cuff Size: Large)   Pulse 93   Ht 5\' 9"  (1.753 m)   Wt (!) 304 lb (137.9 kg)   LMP 10/10/2019   BMI 44.89 kg/m   Pelvic exam:  Two IUD strings present seen coming from the cervical os. EGBUS, vaginal vault and cervix: within normal limits  IUD Removal Strings of IUD identified and grasped.  IUD removed without problem with ring forceps.  Pt tolerated this well.  IUD noted to be intact.  Assessment:  Encounter for IUD removal    Plan: IUD removed and plan for contraception is abstinence. She was amenable to this plan.  Oceane Fosse B. Lawton Dollinger, PA-C 10/17/2019 9:21 AM

## 2019-10-17 ENCOUNTER — Other Ambulatory Visit: Payer: Self-pay

## 2019-10-17 ENCOUNTER — Ambulatory Visit (INDEPENDENT_AMBULATORY_CARE_PROVIDER_SITE_OTHER): Payer: 59 | Admitting: Obstetrics and Gynecology

## 2019-10-17 ENCOUNTER — Encounter: Payer: Self-pay | Admitting: Obstetrics and Gynecology

## 2019-10-17 VITALS — BP 138/78 | HR 93 | Ht 69.0 in | Wt 304.0 lb

## 2019-10-17 DIAGNOSIS — Z30432 Encounter for removal of intrauterine contraceptive device: Secondary | ICD-10-CM

## 2019-10-24 ENCOUNTER — Encounter: Payer: Self-pay | Admitting: Obstetrics and Gynecology

## 2019-10-26 ENCOUNTER — Other Ambulatory Visit: Payer: Self-pay | Admitting: Obstetrics and Gynecology

## 2019-10-26 MED ORDER — MEDROXYPROGESTERONE ACETATE 10 MG PO TABS: 10.0000 mg | ORAL_TABLET | Freq: Every day | ORAL | 0 refills | Status: DC

## 2019-10-26 NOTE — Progress Notes (Signed)
Rx provera to stop bleeding after IUD removal.

## 2019-10-27 ENCOUNTER — Ambulatory Visit: Payer: 59 | Admitting: Obstetrics and Gynecology

## 2019-12-12 ENCOUNTER — Other Ambulatory Visit: Payer: Self-pay | Admitting: Obstetrics and Gynecology

## 2019-12-12 MED ORDER — VALACYCLOVIR HCL 500 MG PO TABS: 500.0000 mg | ORAL_TABLET | Freq: Two times a day (BID) | ORAL | 6 refills | Status: AC

## 2020-02-09 ENCOUNTER — Other Ambulatory Visit: Payer: Self-pay | Admitting: Obstetrics and Gynecology

## 2020-02-09 MED ORDER — VALACYCLOVIR HCL 1 G PO TABS: 1000.0000 mg | ORAL_TABLET | Freq: Every day | ORAL | 11 refills | Status: DC

## 2020-02-19 ENCOUNTER — Other Ambulatory Visit: Payer: Self-pay | Admitting: Obstetrics and Gynecology

## 2020-02-19 MED ORDER — METRONIDAZOLE 500 MG PO TABS: 500.0000 mg | ORAL_TABLET | Freq: Two times a day (BID) | ORAL | 0 refills | Status: AC

## 2020-05-10 ENCOUNTER — Ambulatory Visit: Payer: 59 | Admitting: Obstetrics and Gynecology

## 2021-02-03 ENCOUNTER — Other Ambulatory Visit: Payer: Self-pay

## 2021-02-03 ENCOUNTER — Ambulatory Visit (INDEPENDENT_AMBULATORY_CARE_PROVIDER_SITE_OTHER): Payer: 59 | Admitting: Internal Medicine

## 2021-02-03 ENCOUNTER — Encounter: Payer: Self-pay | Admitting: Internal Medicine

## 2021-02-03 VITALS — BP 140/90 | HR 82 | Ht 69.0 in | Wt 292.1 lb

## 2021-02-03 DIAGNOSIS — E8881 Metabolic syndrome: Secondary | ICD-10-CM | POA: Diagnosis not present

## 2021-02-03 DIAGNOSIS — J323 Chronic sphenoidal sinusitis: Secondary | ICD-10-CM | POA: Diagnosis not present

## 2021-02-03 DIAGNOSIS — Z6841 Body Mass Index (BMI) 40.0 and over, adult: Secondary | ICD-10-CM

## 2021-02-03 DIAGNOSIS — I10 Essential (primary) hypertension: Secondary | ICD-10-CM

## 2021-02-03 NOTE — Progress Notes (Signed)
Acute Office Visit  Subjective:    Patient ID: Ashley Yates, female    DOB: 12-30-1995, 25 y.o.   MRN: [id number]  Chief Complaint  Patient presents with  . New Patient (Initial Visit)    Having complaints of high blood pressure     HPI Patient is in today for blood pressure evaluation.  He denies chest pain shortness of breath swelling of the legs and feeling dizzy lurches checked her blood pressure and it was found to be elevated concerning came to the office for a checkup.  I checked her blood pressure myself and it was found to be 140/90, she is not on any blood control pills.  She works as a Interior and spatial designer.  Past Medical History:  Diagnosis Date  . Ovarian cyst   . 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 COLON SURGERY     Exploratory surgery and colonscopy   . DIAGNOSTIC LAPAROSCOPY  05/22/2013  . INTRAUTERINE DEVICE (IUD) INSERTION  09/22/2016  . TONSILLECTOMY  at age 77    Family History  Problem Relation Age of Onset  . Kidney Stones Mother   . Polycystic ovary syndrome Mother   . Hypertension Father   . Hyperlipidemia Father   . Polycystic ovary syndrome Maternal Grandmother   . Colon cancer Maternal Grandfather 52    Social History   Socioeconomic History  . Marital status: Single    Spouse name: Not on file  . Number of children: Not on file  . Years of education: Not on file  . Highest education level: Not on file  Occupational History  . Not on file  Tobacco Use  . Smoking status: Never Smoker  . Smokeless tobacco: Never Used  Vaping Use  . Vaping Use: Never used  Substance and Sexual Activity  . Alcohol use: Yes    Comment: occasionally  . Drug use: No  . Sexual activity: Yes    Birth control/protection: I.U.D.  Other Topics Concern  . Not on file  Social History Narrative  . Not on file   Social Determinants of Health   Financial Resource  Strain: Not on file  Food Insecurity: Not on file  Transportation Needs: Not on file  Physical Activity: Not on file  Stress: Not on file  Social Connections: Not on file  Intimate Partner Violence: Not on file    Outpatient Medications Prior to Visit  Medication Sig Dispense Refill  . Collagen-Vitamin C-Biotin (COLLAGEN 1500/C PO) Take by mouth.    Marland Kitchen ibuprofen (ADVIL) 100 MG tablet Take 100 mg by mouth every 6 (six) hours as needed for fever.    . Multiple Vitamin (MULTIVITAMIN) tablet Take 1 tablet by mouth daily.    . Probiotic Product (PROBIOTIC-10 PO) Take by mouth.    . valACYclovir (VALTREX) 1000 MG tablet Take 1 tablet (1,000 mg total) by mouth daily. 30 tablet 11  . Elagolix Sodium (ORILISSA) 150 MG TABS Take 1 tablet by mouth daily. (Patient not taking: Reported on 10/17/2019) 30 tablet 11  . medroxyPROGESTERone (PROVERA) 10 MG tablet Take 1 tablet (10 mg total) by mouth daily for 7 days. 7 tablet 0   No facility-administered medications prior to visit.    Allergies  Allergen Reactions  . Etodolac Hives and Swelling    Review of Systems  Constitutional: Negative.   HENT: Negative.   Eyes: Negative.   Respiratory: Negative.  Cardiovascular: Negative.   Gastrointestinal: Negative.   Endocrine: Negative.   Genitourinary: Negative.   Musculoskeletal: Negative.   Skin: Negative.   Allergic/Immunologic: Negative.   Neurological: Negative.   Hematological: Negative.   Psychiatric/Behavioral: Negative.   All other systems reviewed and are negative.      Objective:    Physical Exam Constitutional:      Appearance: She is obese.  HENT:     Head: Normocephalic. No contusion.     Nose: Nose normal.  Eyes:     General: Lids are normal. Vision grossly intact.     Extraocular Movements: Extraocular movements intact.     Conjunctiva/sclera: Conjunctivae normal.  Cardiovascular:     Rate and Rhythm: Regular rhythm.     Pulses: Normal pulses.  Pulmonary:      Effort: Pulmonary effort is normal.     Breath sounds: Normal breath sounds.  Abdominal:     General: Bowel sounds are normal.     Palpations: Abdomen is soft.  Musculoskeletal:     Cervical back: Normal range of motion and neck supple.  Skin:    General: Skin is warm.  Neurological:     General: No focal deficit present.     Mental Status: She is alert.  Psychiatric:        Mood and Affect: Mood normal.     BP 140/90   Pulse 82   Ht 5\' 9"  (1.753 m)   Wt 292 lb 1.6 oz (132.5 kg)   BMI 43.14 kg/m  Wt Readings from Last 3 Encounters:  02/03/21 292 lb 1.6 oz (132.5 kg)  10/17/19 (!) 304 lb (137.9 kg)  05/26/19 (!) 310 lb (140.6 kg)    Health Maintenance Due  Topic Date Due  . Hepatitis C Screening  Never done  . COVID-19 Vaccine (1) Never done  . HPV VACCINES (1 - 2-dose series) Never done  . HIV Screening  Never done  . TETANUS/TDAP  Never done       Topic Date Due  . HPV VACCINES (1 - 2-dose series) Never done     Lab Results  Component Value Date   TSH 1.820 02/22/2017   Lab Results  Component Value Date   WBC 13.1 (H) 06/23/2014   HGB 12.0 06/23/2014   HCT 38.1 06/23/2014   MCV 84 06/23/2014   PLT 300 06/23/2014   Lab Results  Component Value Date   NA 137 06/23/2014   K 4.1 06/23/2014   CO2 24 06/23/2014   GLUCOSE 89 06/23/2014   BUN 11 06/23/2014   CREATININE 0.71 06/23/2014   BILITOT 0.7 06/23/2014   ALKPHOS 56 06/23/2014   AST 22 06/23/2014   ALT 20 06/23/2014   PROT 7.8 06/23/2014   ALBUMIN 3.7 (L) 06/23/2014   CALCIUM 8.9 (L) 06/23/2014   ANIONGAP 8 06/23/2014   No results found for: CHOL No results found for: HDL No results found for: LDLCALC No results found for: TRIG No results found for: CHOLHDL Lab Results  Component Value Date   HGBA1C 5.3 02/22/2017       Assessment & Plan:   Problem List Items Addressed This Visit      Cardiovascular and Mediastinum   Hypertension - Primary    Patient blood pressure is normal  patient denies any chest pain or shortness of breath there is no history of palpitation or paroxysmal nocturnal dyspnea   patient was advised to follow low-salt low-cholesterol diet    ideally I want to keep  systolic blood pressure below 629 mmHg, patient was asked to check blood pressure one times a week and give me a report on that.  Patient will be follow-up in 3 months  or earlier as needed, patient will call me back for any change in the cardiovascular symptoms           Respiratory   Sinusitis    Take loratadine 10 mg p.o. daily        Other   Body mass index (BMI) of 40.0-44.9 in adult [facility])    Patient is trying to lose weight she has already lost 7 pounds      Metabolic syndrome    - I encouraged the patient to lose weight.  - I educated them on making healthy dietary choices including eating more fruits and vegetables and less fried foods. - I encouraged the patient to exercise more, and educated on the benefits of exercise including weight loss, diabetes prevention, and hypertension prevention.   Dietary counseling with a registered dietician  Referral to a weight management support group (e.g. Weight Watchers, Overeaters Anonymous)  If your BMI is greater than 29 or you have gained more than 15 pounds you should work on weight loss.  Attend a healthy cooking class         Patient was started on DASH diet  No orders of the defined types were placed in this encounter.    Corky Downs, MD

## 2021-02-03 NOTE — Assessment & Plan Note (Signed)
Patient blood pressure is normal patient denies any chest pain or shortness of breath there is no history of palpitation or paroxysmal nocturnal dyspnea   patient was advised to follow low-salt low-cholesterol diet    ideally I want to keep systolic blood pressure below 130 mmHg, patient was asked to check blood pressure one times a week and give me a report on that.  Patient will be follow-up in 3 months  or earlier as needed, patient will call me back for any change in the cardiovascular symptoms    

## 2021-02-03 NOTE — Assessment & Plan Note (Signed)
Take loratadine 10 mg p.o. daily

## 2021-02-03 NOTE — Assessment & Plan Note (Signed)
Patient is trying to lose weight she has already lost 7 pounds

## 2021-02-03 NOTE — Assessment & Plan Note (Signed)

## 2021-02-04 NOTE — Addendum Note (Signed)
Addended by: Jobie Quaker on: 02/04/2021 08:39 AM   Modules accepted: Orders

## 2021-02-05 LAB — LIPID PANEL
Cholesterol: 207 mg/dL — ABNORMAL HIGH (ref <200)
HDL: 35 mg/dL — ABNORMAL LOW (ref >=50)
LDL Cholesterol (Calc): 137 mg/dL (calc) — ABNORMAL HIGH
Non-HDL Cholesterol (Calc): 172 mg/dL (calc) — ABNORMAL HIGH (ref <130)
Total CHOL/HDL Ratio: 5.9 (calc) — ABNORMAL HIGH (ref <5.0)
Triglycerides: 208 mg/dL — ABNORMAL HIGH (ref <150)

## 2021-02-05 LAB — COMPLETE METABOLIC PANEL WITH GFR
AG Ratio: 1.6 (calc) (ref 1.0–2.5)
ALT: 67 U/L — ABNORMAL HIGH (ref 6–29)
AST: 29 U/L (ref 10–30)
Albumin: 4.4 g/dL (ref 3.6–5.1)
Alkaline phosphatase (APISO): 56 U/L (ref 31–125)
BUN: 15 mg/dL (ref 7–25)
CO2: 22 mmol/L (ref 20–32)
Calcium: 9.2 mg/dL (ref 8.6–10.2)
Chloride: 105 mmol/L (ref 98–110)
Creat: 0.71 mg/dL (ref 0.50–1.10)
GFR, Est African American: 136 mL/min/{1.73_m2} (ref >=60)
GFR, Est Non African American: 118 mL/min/{1.73_m2} (ref >=60)
Globulin: 2.7 g/dL (calc) (ref 1.9–3.7)
Glucose, Bld: 97 mg/dL (ref 65–99)
Potassium: 4.5 mmol/L (ref 3.5–5.3)
Sodium: 139 mmol/L (ref 135–146)
Total Bilirubin: 0.5 mg/dL (ref 0.2–1.2)
Total Protein: 7.1 g/dL (ref 6.1–8.1)

## 2021-02-05 LAB — CBC WITH DIFFERENTIAL/PLATELET
Absolute Monocytes: 612 cells/uL (ref 200–950)
Basophils Absolute: 94 cells/uL (ref 0–200)
Basophils Relative: 1.1 %
Eosinophils Absolute: 502 cells/uL — ABNORMAL HIGH (ref 15–500)
Eosinophils Relative: 5.9 %
HCT: 38.7 % (ref 35.0–45.0)
Hemoglobin: 12.7 g/dL (ref 11.7–15.5)
Lymphs Abs: 2839 cells/uL (ref 850–3900)
MCH: 29.1 pg (ref 27.0–33.0)
MCHC: 32.8 g/dL (ref 32.0–36.0)
MCV: 88.6 fL (ref 80.0–100.0)
MPV: 10.7 fL (ref 7.5–12.5)
Monocytes Relative: 7.2 %
Neutro Abs: 4454 cells/uL (ref 1500–7800)
Neutrophils Relative %: 52.4 %
Platelets: 369 10*3/uL (ref 140–400)
RBC: 4.37 10*6/uL (ref 3.80–5.10)
RDW: 13.1 % (ref 11.0–15.0)
Total Lymphocyte: 33.4 %
WBC: 8.5 10*3/uL (ref 3.8–10.8)

## 2021-02-05 LAB — TSH: TSH: 2.36 mIU/L

## 2021-03-09 ENCOUNTER — Telehealth: Payer: Self-pay

## 2021-03-09 NOTE — Telephone Encounter (Signed)
Pt calling; has kidney inf which is common for her; would like to come in AM and give specimen.  [phone]

## 2021-03-10 ENCOUNTER — Ambulatory Visit (INDEPENDENT_AMBULATORY_CARE_PROVIDER_SITE_OTHER): Payer: 59

## 2021-03-10 ENCOUNTER — Other Ambulatory Visit: Payer: Self-pay

## 2021-03-10 DIAGNOSIS — M545 Low back pain, unspecified: Secondary | ICD-10-CM | POA: Diagnosis not present

## 2021-03-10 LAB — POCT URINALYSIS DIPSTICK
Appearance: NEGATIVE
Bilirubin, UA: NEGATIVE
Blood, UA: NEGATIVE
Glucose, UA: NEGATIVE
Ketones, UA: NEGATIVE
Leukocytes, UA: NEGATIVE
Nitrite, UA: NEGATIVE
Protein, UA: NEGATIVE
Spec Grav, UA: 1.01 (ref 1.010–1.025)
Urobilinogen, UA: 0.2 E.U./dL
pH, UA: 5 (ref 5.0–8.0)

## 2021-03-12 ENCOUNTER — Other Ambulatory Visit: Payer: Self-pay | Admitting: Obstetrics and Gynecology

## 2021-05-16 ENCOUNTER — Ambulatory Visit: Payer: 59 | Admitting: Obstetrics and Gynecology

## 2022-12-06 ENCOUNTER — Ambulatory Visit (INDEPENDENT_AMBULATORY_CARE_PROVIDER_SITE_OTHER): Payer: Self-pay | Admitting: Certified Nurse Midwife

## 2022-12-06 ENCOUNTER — Encounter: Payer: Self-pay | Admitting: Certified Nurse Midwife

## 2022-12-06 ENCOUNTER — Other Ambulatory Visit (HOSPITAL_COMMUNITY)
Admission: RE | Admit: 2022-12-06 | Discharge: 2022-12-06 | Disposition: A | Discharge status: Home / Self Care | Payer: Self-pay | Source: Ambulatory Visit | Attending: Certified Nurse Midwife | Admitting: Certified Nurse Midwife

## 2022-12-06 VITALS — BP 128/82 | HR 85 | Wt 292.6 lb

## 2022-12-06 DIAGNOSIS — N898 Other specified noninflammatory disorders of vagina: Secondary | ICD-10-CM

## 2022-12-06 NOTE — Progress Notes (Signed)
GYN ENCOUNTER NOTE  Subjective:       Ashley Yates is a 27 y.o. G0P0000 female is here for gynecologic evaluation of the following issues:  1. Vaginal odor she noticed a few days ago after her period started. She used boric acid vaginal suppository that she felt like helped at first but has noticed an odor again. She denies concern of STD and declines STD testing today. .     Gynecologic History Patient's last menstrual period was 11/20/2022 (exact date). Contraception: none Last Pap: 06/17/2020. Results were: normal Last mammogram: n/a .   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     ibuprofen (ADVIL) 100 MG tablet Take 100 mg by mouth every 6 (six) hours as needed for fever.     Multiple Vitamin (MULTIVITAMIN) tablet Take 1 tablet by mouth daily.     Probiotic Product (PROBIOTIC-10 PO) Take by mouth.     Tirzepatide [facility]) Inject into the skin.     valACYclovir (VALTREX) 1000 MG tablet TAKE 1 TABLET(1000 MG) BY MOUTH DAILY 30 tablet 11   medroxyPROGESTERone (PROVERA) 10 MG tablet Take 1 tablet (10 mg total) by mouth daily for 7 days. 7 tablet 0   No current facility-administered medications on file prior to visit.    Allergies  Allergen Reactions   Etodolac Hives and Swelling    Social History   Socioeconomic History   Marital status: Single    Spouse name: Not on  file   Number of children: Not on file   Years of education: Not on file   Highest education level: Not on file  Occupational History   Not on file  Tobacco Use   Smoking status: Never   Smokeless tobacco: Never  Vaping Use   Vaping Use: Never used  Substance and Sexual Activity   Alcohol use: Yes    Comment: occasionally   Drug use: No   Sexual activity: Yes    Birth control/protection: I.U.D.  Other Topics Concern   Not on file  Social History Narrative   Not on file   Social Determinants of Health   Financial Resource Strain: Not on file  Food Insecurity: Not on file  Transportation Needs: Not on file  Physical Activity: Not on file  Stress: Not on file  Social Connections: Not on file  Intimate Partner Violence: Not on file    Family History  Problem Relation Age of Onset   Kidney Stones Mother    Polycystic ovary syndrome Mother    Hypertension Father    Hyperlipidemia Father    Polycystic ovary syndrome Maternal Grandmother    Colon cancer Maternal Grandfather 63    The following portions of the patient's history were reviewed and updated as appropriate: allergies, current medications, past family history, past  medical history, past social history, past surgical history and problem list.  Review of Systems Review of Systems - Negative except as mentioned in HPI Review of Systems - General ROS: negative for - chills, fatigue, fever, hot flashes, malaise or night sweats Hematological and Lymphatic ROS: negative for - bleeding problems or swollen lymph nodes Gastrointestinal ROS: negative for - abdominal pain, blood in stools, change in bowel habits and nausea/vomiting Musculoskeletal ROS: negative for - joint pain, muscle pain or muscular weakness Genito-Urinary ROS: negative for - change in menstrual cycle, dysmenorrhea, dyspareunia, dysuria,, genital ulcers, hematuria, incontinence, irregular/heavy menses, nocturia or pelvic pain. Positive for abnormal  discharge with odor.   Objective:   BP 128/82   Pulse 85   Wt 292 lb 9.6 oz (132.7 kg)   LMP 11/20/2022 (Exact Date)   BMI 43.21 kg/m  CONSTITUTIONAL: Well-developed, well-nourished female in no acute distress.  HENT:  Normocephalic, atraumatic.  NECK: Normal range of motion, supple, no masses.  Normal thyroid.  SKIN: Skin is warm and dry. No rash noted. Not diaphoretic. No erythema. No pallor. [place]: Alert and oriented to person, place, and time. PSYCHIATRIC: Normal mood and affect. Normal behavior. Normal judgment and thought content. CARDIOVASCULAR:Not Examined RESPIRATORY: Not Examined BREASTS: Not Examined ABDOMEN: Soft, non distended; Non tender.  No Organomegaly. PELVIC:  External Genitalia: Normal  BUS: Normal  Vagina: Normal  Cervix: Normal, clear to white discharge no odor, swab collected  MUSCULOSKELETAL: Normal range of motion. No tenderness.  No cyanosis, clubbing, or edema.     Assessment:   1. Vaginal discharge     Plan:   Discussed common cause BV/yeast infection. Pt encouraged to continue to use boric acid prn for symptoms. Swab collected. Will follow up with result. Pt encouraged to return for annual /pap smear.   Philip Aspen, CNM

## 2022-12-06 NOTE — Addendum Note (Signed)
Addended by: Minette Headland on: 12/06/2022 04:07 PM   Modules accepted: Orders

## 2022-12-06 NOTE — Patient Instructions (Signed)

## 2022-12-08 LAB — CERVICOVAGINAL ANCILLARY ONLY
Bacterial Vaginitis (gardnerella): POSITIVE — AB
Candida Glabrata: NEGATIVE
Candida Vaginitis: NEGATIVE
Chlamydia: NEGATIVE
Comment: NEGATIVE
Comment: NEGATIVE
Comment: NEGATIVE
Comment: NEGATIVE
Comment: NEGATIVE
Comment: NORMAL
Neisseria Gonorrhea: NEGATIVE
Trichomonas: NEGATIVE

## 2022-12-11 ENCOUNTER — Encounter: Payer: Self-pay | Admitting: Certified Nurse Midwife

## 2022-12-11 ENCOUNTER — Other Ambulatory Visit: Payer: Self-pay | Admitting: Certified Nurse Midwife

## 2022-12-11 MED ORDER — METRONIDAZOLE 0.75 % VA GEL: 1.0000 | Freq: Every day | VAGINAL | 0 refills | Status: AC

## 2022-12-11 MED ORDER — METRONIDAZOLE 500 MG PO TABS: 500.0000 mg | ORAL_TABLET | Freq: Two times a day (BID) | ORAL | 0 refills | Status: DC

## 2023-01-09 NOTE — Progress Notes (Deleted)
PCP:  Cletis Athens, MD   No chief complaint on file.    HPI:      Ashley Yates is a 27 y.o. G0P0000 whose LMP was No LMP recorded. (Menstrual status: IUD)., presents today for her annual examination.  Her menses are {norm/abn:715}, lasting {number: 22536} days.  Dysmenorrhea {dysmen:716}. She {does:18564} have intermenstrual bleeding.Hx of endometriosis/CPP and menorrhagia. IUD helped with bleeding. No relief with Freida Busman so stopped that. Wants IUD removed and to just be off everything for a few months. Mirena removed 1/21 per pt pref;  Sex activity: {sex active: 315163}.  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 There is no FH of ovarian cancer. The patient {does:18564} do self-breast exams.  Tobacco use: {tob:20664} Alcohol use: {Alcohol:11675} No drug use.  Exercise: {exercise:31265}  She {does:18564} get adequate calcium and Vitamin D in her diet.  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    Past Surgical History:  Procedure Laterality Date   COLON SURGERY     Exploratory surgery and colonscopy    DIAGNOSTIC LAPAROSCOPY  05/22/2013   INTRAUTERINE DEVICE (IUD) INSERTION  09/22/2016   TONSILLECTOMY  at age 26    Family History  Problem Relation Age of Onset   Kidney Stones Mother    Polycystic ovary syndrome Mother    Hypertension Father    Hyperlipidemia Father    Polycystic ovary syndrome Maternal Grandmother    Colon cancer Maternal Grandfather 50    Social  History   Socioeconomic History   Marital status: Single    Spouse name: Not on file   Number of children: Not on file   Years of education: Not on file   Highest education level: Not on file  Occupational History   Not on file  Tobacco Use   Smoking status: Never   Smokeless tobacco: Never  Vaping Use   Vaping Use: Never used  Substance and Sexual Activity   Alcohol use: Yes    Comment: occasionally   Drug use: No   Sexual activity: Yes    Birth control/protection: I.U.D.  Other Topics Concern   Not on file  Social History Narrative   Not on file   Social Determinants of Health   Financial Resource Strain: Not on file  Food Insecurity: Not on file  Transportation Needs: Not on file  Physical Activity: Not on file  Stress: Not on file  Social Connections: Not on file  Intimate Partner Violence: Not on file     Current Outpatient Medications:    Collagen-Vitamin C-Biotin (COLLAGEN 1500/C PO), Take by mouth., Disp: , Rfl:    ibuprofen (ADVIL) 100 MG tablet, Take 100 mg by mouth every 6 (six) hours as needed for fever., Disp: , Rfl:    medroxyPROGESTERone (PROVERA) 10 MG tablet, Take 1 tablet (10 mg total) by mouth daily for 7 days., Disp: 7 tablet, Rfl: 0   Multiple Vitamin (MULTIVITAMIN) tablet, Take 1 tablet by mouth daily., Disp: , Rfl:    Probiotic Product (PROBIOTIC-10 PO), Take by mouth., Disp: ,  Rfl:    Tirzepatide (MOUNJARO [place]), Inject into the skin., Disp: , Rfl:    valACYclovir (VALTREX) 1000 MG tablet, TAKE 1 TABLET(1000 MG) BY MOUTH DAILY, Disp: 30 tablet, Rfl: 11     ROS:  Review of Systems BREAST: No symptoms   Objective: There were no vitals taken for this visit.   OBGyn Exam  Results: No results found for this or any previous visit (from the past 24 hour(s)).  Assessment/Plan: No diagnosis found.  No orders of the defined types were placed in this encounter.            GYN counsel {counseling: 16159}     F/U  No follow-ups on  file.  Lavanda Nevels B. Crickett Abbett, PA-C 01/09/2023 4:54 PM

## 2023-01-11 ENCOUNTER — Ambulatory Visit: Payer: Self-pay | Admitting: Obstetrics and Gynecology

## 2023-01-11 DIAGNOSIS — Z124 Encounter for screening for malignant neoplasm of cervix: Secondary | ICD-10-CM

## 2023-01-11 DIAGNOSIS — Z01419 Encounter for gynecological examination (general) (routine) without abnormal findings: Secondary | ICD-10-CM

## 2023-02-22 ENCOUNTER — Ambulatory Visit: Payer: Self-pay

## 2023-02-23 ENCOUNTER — Ambulatory Visit
Admission: EM | Admit: 2023-02-23 | Discharge: 2023-02-23 | Disposition: A | Discharge status: Home / Self Care | Payer: Self-pay | Attending: Family Medicine | Admitting: Family Medicine

## 2023-02-23 DIAGNOSIS — Z87891 Personal history of nicotine dependence: Secondary | ICD-10-CM

## 2023-02-23 DIAGNOSIS — J40 Bronchitis, not specified as acute or chronic: Secondary | ICD-10-CM

## 2023-02-23 DIAGNOSIS — J329 Chronic sinusitis, unspecified: Secondary | ICD-10-CM

## 2023-02-23 MED ORDER — PREDNISONE 10 MG (21) PO TBPK: ORAL_TABLET | Freq: Every day | ORAL | 0 refills | Status: DC

## 2023-02-23 MED ORDER — FLUCONAZOLE 150 MG PO TABS: 150.0000 mg | ORAL_TABLET | ORAL | 0 refills | Status: AC

## 2023-02-23 MED ORDER — AMOXICILLIN-POT CLAVULANATE 875-125 MG PO TABS: 1.0000 | ORAL_TABLET | Freq: Two times a day (BID) | ORAL | 0 refills | Status: DC

## 2023-02-23 NOTE — ED Provider Notes (Signed)
[facility]    CSN: [id number] Arrival date & time: 02/23/23  0940      History   Chief Complaint Chief Complaint  Patient presents with   Cough    HPI Ashley Yates is a 27 y.o. female.   HPI   Ashley Yates presents for cough for the past 2 weeks. Symptoms started out as a sinus issues. She wakes up in the morning okay but her symptoms gets worse without the day. She took some allergy medication which helped for about an hour.  She took some old cough medicine which didn't help either. Her brother was sick with similar sx.  She is a former smoker.    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   Right tube diagnosed on Laparoscopy    Patient Active Problem List   Diagnosis Date Noted   Metabolic syndrome 02/03/2021   Hypertension 02/03/2021   Hidradenitis suppurativa 03/08/2018   Body mass index (BMI) of 40.0-44.9 in adult [facility]) 03/08/2018   Conjunctivitis 05/15/2015   Sinusitis 05/14/2015   Chronic constipation 06/23/2014   H/O female genital system disorder 06/23/2014   Adiposity 06/23/2014   Adenosalpingitis 06/23/2014    Past Surgical History:  Procedure Laterality Date   COLON SURGERY     Exploratory surgery and colonscopy    DIAGNOSTIC LAPAROSCOPY  05/22/2013   INTRAUTERINE DEVICE (IUD) INSERTION  09/22/2016   TONSILLECTOMY  at age 22    OB History     Gravida  0   Para  0   Term  0   Preterm  0   AB  0   Living  0      SAB  0   IAB  0   Ectopic  0   Multiple  0   Live Births  0            Home Medications    Prior to Admission medications   Medication Sig Start Date End Date Taking? Authorizing Provider  amoxicillin-clavulanate (AUGMENTIN) 875-125 MG tablet Take 1 tablet by mouth every 12 (twelve) hours. 02/23/23  Yes Wessley Emert, DO  fluconazole (DIFLUCAN) 150 MG tablet Take 1 tablet (150 mg total) by mouth every 3 (three) days for 2 doses. 02/23/23 02/27/23 Yes  Saima Monterroso, DO  ibuprofen (ADVIL) 100 MG tablet Take 100 mg by mouth every 6 (six) hours as needed for fever.   Yes [provider]  predniSONE (STERAPRED UNI-PAK 21 TAB) 10 MG (21) TBPK tablet Take by mouth daily. Take 6 tabs by mouth daily for 1, then 5 tabs for 1 day, then 4 tabs for 1 day, then 3 tabs for 1 day, then 2 tabs for 1 day, then 1 tab for 1 day. 02/23/23  Yes Analisse Randle, Seward Meth, DO  Collagen-Vitamin C-Biotin (COLLAGEN 1500/C PO) Take by mouth.    [provider]  medroxyPROGESTERone (PROVERA) 10 MG tablet Take 1 tablet (10 mg total) by mouth daily for 7 days. 10/26/19 11/02/19  Copland, Ilona Sorrel, PA-C  Multiple Vitamin (MULTIVITAMIN) tablet Take 1 tablet by mouth daily.    [provider]  Probiotic Product (PROBIOTIC-10 PO) Take by mouth.    [provider]  Tirzepatide [facility]) Inject into the skin.    [provider]  valACYclovir (VALTREX) 1000 MG tablet TAKE 1 TABLET(1000 MG) BY MOUTH DAILY 03/13/21   Vena Austria, MD    Family History Family History  Problem Relation Age  of Onset   Kidney Stones Mother    Polycystic ovary syndrome Mother    Hypertension Father    Hyperlipidemia Father    Polycystic ovary syndrome Maternal Grandmother    Colon cancer Maternal Grandfather 70    Social History Social History   Tobacco Use   Smoking status: Never   Smokeless tobacco: Never  Vaping Use   Vaping Use: Never used  Substance Use Topics   Alcohol use: Yes    Comment: occasionally   Drug use: No     Allergies   Etodolac   Review of Systems Review of Systems: negative unless otherwise stated in HPI.      Physical Exam Triage Vital Signs ED Triage Vitals  Enc Vitals Group     BP 02/23/23 1011 (!) 144/103     Pulse Rate 02/23/23 1011 71     Resp --      Temp 02/23/23 1011 98.2 F (36.8 C)     Temp Source 02/23/23 1011 Oral     SpO2 02/23/23 1011 100 %     Weight 02/23/23 1009 280 lb (127 kg)      Height 02/23/23 1009 5\' 9"  (1.753 m)     Head Circumference --      Peak Flow --      Pain Score 02/23/23 1009 7     Pain Loc --      Pain Edu? --      Excl. in GC? --    No data found.  Updated Vital Signs BP (!) 144/103 (BP Location: Left Arm)   Pulse 71   Temp 98.2 F (36.8 C) (Oral)   Ht 5\' 9"  (1.753 m)   Wt 127 kg   LMP 01/24/2023   SpO2 100%   BMI 41.35 kg/m   Visual Acuity Right Eye Distance:   Left Eye Distance:   Bilateral Distance:    Right Eye Near:   Left Eye Near:    Bilateral Near:     Physical Exam GEN:     alert, ill appearing female in no distress    HENT:  mucus membranes moist, oropharyngeal without lesions or erythema, no tonsillar hypertrophy or exudates, clear nasal discharge, bilateral TM normal EYES:   pupils equal and reactive, no scleral injection or discharge NECK:  normal ROM, no lymphadenopathy, no meningismus   RESP:  no increased work of breathing, clear to auscultation bilaterally though difficulty to auscultate due to body habitus  CVS:   regular rate and rhythm Skin:   warm and dry, no rash on visible skin    UC Treatments / Results  Labs (all labs ordered are listed, but only abnormal results are displayed) Labs Reviewed - No data to display  EKG   Radiology No results found.  Procedures Procedures (including critical care time)  Medications Ordered in UC Medications - No data to display  Initial Impression / Assessment and Plan / UC Course  I have reviewed the triage vital signs and the nursing notes.  Pertinent labs & imaging results that were available during my care of the patient were reviewed by me and considered in my medical decision making (see chart for details).       Pt is a 27 y.o. female former smoker who presents for 2 weeks of cough that is not improving.  Licette is  afebrile here without recent antipyretics. Satting adequately on room air. Overall pt is  non-toxic appearing, well hydrated, without  respiratory distress. Pulmonary exam  is unremarkable except for cough. Chest imaging deferred as it will likely not change initial management.  COVID  and influenza testing deferred due to length of symptoms.  Treat acute sinobronchitis with steroids and antibiotics as below.   Recommended she take an allergy medication as well. Typical duration of symptoms discussed. Return and ED precautions given and patient voiced understanding.   Discussed MDM, treatment plan and plan for follow-up with patient who agrees with plan.     Final Clinical Impressions(s) / UC Diagnoses   Final diagnoses:  Sinobronchitis     Discharge Instructions      Stop by the pharmacy to pick up your prescriptions.  Follow up with your primary care provider as needed.      ED Prescriptions     Medication Sig Dispense Auth. Provider   amoxicillin-clavulanate (AUGMENTIN) 875-125 MG tablet Take 1 tablet by mouth every 12 (twelve) hours. 14 tablet Sheyla Zaffino, DO   predniSONE (STERAPRED UNI-PAK 21 TAB) 10 MG (21) TBPK tablet Take by mouth daily. Take 6 tabs by mouth daily for 1, then 5 tabs for 1 day, then 4 tabs for 1 day, then 3 tabs for 1 day, then 2 tabs for 1 day, then 1 tab for 1 day. 21 tablet Enna Warwick, DO   fluconazole (DIFLUCAN) 150 MG tablet Take 1 tablet (150 mg total) by mouth every 3 (three) days for 2 doses. 2 tablet Katha Cabal, DO      PDMP not reviewed this encounter.   Katha Cabal, DO 02/23/23 1050

## 2023-02-23 NOTE — ED Triage Notes (Signed)
Pt c/o cough and congestion x2weeks  Pt states that her cough gets worse through the day.  Pt states that when she coughs her throat feels sore.  Pt has tried OTC  Pt was sick in January and was given Tessalon and took it for her current symptoms. Pt states that it did not help.

## 2023-02-23 NOTE — Discharge Instructions (Addendum)
Stop by the pharmacy to pick up your prescriptions.  Follow up with your primary care provider as needed.  

## 2023-05-21 ENCOUNTER — Other Ambulatory Visit (HOSPITAL_COMMUNITY)
Admission: RE | Admit: 2023-05-21 | Discharge: 2023-05-21 | Disposition: A | Discharge status: Home / Self Care | Payer: 59 | Source: Ambulatory Visit | Attending: Certified Nurse Midwife | Admitting: Certified Nurse Midwife

## 2023-05-21 ENCOUNTER — Encounter: Payer: Self-pay | Admitting: Certified Nurse Midwife

## 2023-05-21 ENCOUNTER — Ambulatory Visit (INDEPENDENT_AMBULATORY_CARE_PROVIDER_SITE_OTHER): Payer: 59 | Admitting: Certified Nurse Midwife

## 2023-05-21 VITALS — BP 140/90 | HR 66 | Ht 69.0 in | Wt 281.6 lb

## 2023-05-21 DIAGNOSIS — Z124 Encounter for screening for malignant neoplasm of cervix: Secondary | ICD-10-CM | POA: Insufficient documentation

## 2023-05-21 DIAGNOSIS — R102 Pelvic and perineal pain: Secondary | ICD-10-CM

## 2023-05-21 DIAGNOSIS — Z01419 Encounter for gynecological examination (general) (routine) without abnormal findings: Secondary | ICD-10-CM | POA: Insufficient documentation

## 2023-05-21 NOTE — Progress Notes (Signed)
GYNECOLOGY ANNUAL PREVENTATIVE CARE ENCOUNTER NOTE  History:     Ashley Yates is a 27 y.o. G0P0000 female here for a routine annual gynecologic exam.  Current complaints: pelvic pain for the past 6 months. She describes it as in the lower abdomen all across the lower abdomen. 8/10 , it happens sporadically , not during her cycle. She has use ibuprofen and it helps some.    Denies abnormal vaginal bleeding, discharge, pelvic pain, problems with intercourse or other gynecologic concerns.     Social Relationship: single, sexually active Living: alone  Work: Producer, television/film/video Exercise: 3 x week  Smoke/Alcohol/drug use: alcohol use 3x week   Gynecologic History Patient's last menstrual period was 05/12/1999. Contraception: none Last Pap: 05/07/2019. Results were: abnormal  Last mammogram: n/a .    Upstream - 05/21/23 0843       Pregnancy Intention Screening   Does the patient want to become pregnant in the next year? No    Does the patient's partner want to become pregnant in the next year? No    Would the patient like to discuss contraceptive options today? No      Contraception Wrap Up   Current Method Female Condom    End Method Female Condom    Contraception Counseling Provided No            The pregnancy intention screening data noted above was reviewed. Potential methods of contraception were discussed. The patient elected to proceed with Female Condom.   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     ibuprofen (ADVIL) 100 MG tablet Take 100 mg by mouth every 6 (six) hours as needed for fever.     medroxyPROGESTERone (PROVERA) 10 MG tablet Take 1 tablet (10 mg total) by mouth daily for 7 days. 7 tablet 0   Multiple Vitamin (MULTIVITAMIN) tablet Take 1 tablet by mouth daily.     Probiotic Product (PROBIOTIC-10 PO) Take by mouth.     Tirzepatide [facility]) Inject into the skin.     valACYclovir (VALTREX) 1000 MG tablet TAKE 1 TABLET(1000 MG) BY MOUTH DAILY 30 tablet 11   No current facility-administered medications on file prior to visit.    Allergies  Allergen Reactions   Etodolac Hives and Swelling    Social History:  reports that she has never smoked. She has never used smokeless tobacco. She reports current alcohol use. She reports that she does not use drugs.  Family History  Problem Relation Age of Onset   Kidney Stones Mother    Polycystic ovary syndrome Mother    Hypertension Father    Hyperlipidemia Father    Polycystic ovary syndrome Maternal Grandmother    Colon cancer Maternal Grandfather 19    The following portions  of the patient's history were reviewed and updated as appropriate: allergies, current medications, past family history, past medical history, past social history, past surgical history and problem list.  Review of Systems Pertinent items noted in HPI and remainder of comprehensive ROS otherwise negative.  Physical Exam:  BP (!) 140/90   Pulse 66   Ht 5\' 9"  (1.753 m)   Wt 281 lb 9.6 oz (127.7 kg)   LMP 05/12/1999   BMI 41.59 kg/m  CONSTITUTIONAL: Well-developed, well-nourished female in no acute distress.  HENT:  Normocephalic, atraumatic, External right and left ear normal. Oropharynx is clear and moist EYES: Conjunctivae and EOM are normal. Pupils are equal, round, and reactive to light. No scleral  icterus.  NECK: Normal range of motion, supple, no masses.  Normal thyroid.  SKIN: Skin is warm and dry. No rash noted. Not diaphoretic. No erythema. No pallor. MUSCULOSKELETAL: Normal range of motion. No tenderness.  No cyanosis, clubbing, or edema.  2+ distal pulses. NEUROLOGIC: Alert and oriented to person, place, and time. Normal reflexes, muscle tone coordination.  PSYCHIATRIC: Normal mood and affect. Normal behavior. Normal judgment and thought content. CARDIOVASCULAR: Normal heart rate noted, regular rhythm RESPIRATORY: Clear to auscultation bilaterally. Effort and breath sounds normal, no problems with respiration noted. BREASTS: Symmetric in size. No masses, tenderness, skin changes, nipple drainage, or lymphadenopathy bilaterally.  ABDOMEN: Soft, no distention noted.  No tenderness, rebound or guarding.  PELVIC: Normal appearing external genitalia and urethral meatus; normal appearing vaginal mucosa and cervix.  No abnormal discharge noted.  Pap smear obtained.  Pt on her cycle, some blood noted.Mild odor noted.  Normal uterine size, no other palpable masses, no uterine or adnexal tenderness.  .   Assessment and Plan:    1. Women's annual routine gynecological examination  Pap: Will follow up results of pap smear and manage accordingly. Mammogram : n/a  Labs: none , U/s ordered , vaginal swab  Refills: none  Referral: none  Routine preventative health maintenance measures emphasized. Please refer to After Visit Summary for other counseling recommendations.      Doreene Burke, CNM [facility]  [facility]  [facility]

## 2023-05-21 NOTE — Patient Instructions (Signed)

## 2023-05-22 ENCOUNTER — Other Ambulatory Visit: Payer: Self-pay | Admitting: Certified Nurse Midwife

## 2023-05-22 ENCOUNTER — Encounter: Payer: Self-pay | Admitting: Certified Nurse Midwife

## 2023-05-22 MED ORDER — FLUCONAZOLE 150 MG PO TABS: 150.0000 mg | ORAL_TABLET | Freq: Once | ORAL | 1 refills | Status: AC

## 2023-05-22 MED ORDER — METRONIDAZOLE 500 MG PO TABS: 500.0000 mg | ORAL_TABLET | Freq: Two times a day (BID) | ORAL | 0 refills | Status: DC

## 2023-05-22 MED ORDER — METRONIDAZOLE 0.75 % EX GEL: 1.0000 | Freq: Every day | CUTANEOUS | 0 refills | Status: AC

## 2023-05-25 ENCOUNTER — Other Ambulatory Visit: Payer: Self-pay | Admitting: Certified Nurse Midwife

## 2023-06-05 ENCOUNTER — Telehealth: Payer: Self-pay | Admitting: Certified Nurse Midwife

## 2023-06-05 ENCOUNTER — Other Ambulatory Visit: Payer: Self-pay

## 2023-06-05 NOTE — Telephone Encounter (Signed)
Reached out to pt to reschedule transvaginal ultrasound that was scheduled on 06/05/2023 at 2:00 (per AT).  Left message for pt to call back.

## 2023-06-06 ENCOUNTER — Encounter: Payer: Self-pay | Admitting: Certified Nurse Midwife

## 2023-06-06 NOTE — Telephone Encounter (Signed)
Reached out to pt (2x) to reschedule transvaginal ultrasound that was scheduled on 06/05/2023 at 2:00 (per AT).  Was able to reschedule the appt for 07/02/2023 at 11:15.

## 2023-07-02 ENCOUNTER — Telehealth: Payer: Self-pay | Admitting: Certified Nurse Midwife

## 2023-07-02 ENCOUNTER — Other Ambulatory Visit: Payer: Self-pay

## 2023-07-02 NOTE — Telephone Encounter (Signed)
Reached out to pt to reschedule GYN [place] that was scheduled on 07/02/2023.  Left message for pt to call back to reschedule.

## 2023-07-03 ENCOUNTER — Encounter: Payer: Self-pay | Admitting: Certified Nurse Midwife

## 2023-07-03 NOTE — Telephone Encounter (Signed)
Reached out to pt (2x) to reschedule GYN [place] that was scheduled on 07/02/2023.  Left message for pt to call back to reschedule.  Will send a MyChart letter to pt.

## 2023-07-12 ENCOUNTER — Encounter: Payer: Self-pay | Admitting: Certified Nurse Midwife

## 2023-07-13 ENCOUNTER — Other Ambulatory Visit: Payer: Self-pay

## 2023-07-13 MED ORDER — VALACYCLOVIR HCL 1 G PO TABS: 1000.0000 mg | ORAL_TABLET | Freq: Every day | ORAL | 3 refills | Status: AC

## 2023-08-17 ENCOUNTER — Other Ambulatory Visit: Payer: Self-pay | Admitting: Certified Nurse Midwife

## 2023-09-19 ENCOUNTER — Ambulatory Visit (INDEPENDENT_AMBULATORY_CARE_PROVIDER_SITE_OTHER): Payer: Self-pay | Admitting: Licensed Practical Nurse

## 2023-09-19 ENCOUNTER — Other Ambulatory Visit (HOSPITAL_COMMUNITY)
Admission: RE | Admit: 2023-09-19 | Discharge: 2023-09-19 | Disposition: A | Discharge status: Home / Self Care | Payer: Self-pay | Source: Ambulatory Visit | Attending: Licensed Practical Nurse | Admitting: Licensed Practical Nurse

## 2023-09-19 VITALS — BP 146/99 | HR 69 | Wt 285.2 lb

## 2023-09-19 DIAGNOSIS — R030 Elevated blood-pressure reading, without diagnosis of hypertension: Secondary | ICD-10-CM

## 2023-09-19 DIAGNOSIS — N898 Other specified noninflammatory disorders of vagina: Secondary | ICD-10-CM

## 2023-09-19 DIAGNOSIS — B9689 Other specified bacterial agents as the cause of diseases classified elsewhere: Secondary | ICD-10-CM

## 2023-09-19 DIAGNOSIS — N760 Acute vaginitis: Secondary | ICD-10-CM

## 2023-09-19 MED ORDER — METRONIDAZOLE 500 MG PO TABS
500.0000 mg | ORAL_TABLET | Freq: Two times a day (BID) | ORAL | 0 refills | Status: DC
Start: 2023-09-19 — End: 2023-12-05

## 2023-09-19 MED ORDER — METRONIDAZOLE 0.75 % VA GEL
1.0000 | Freq: Every day | VAGINAL | 5 refills | Status: DC
Start: 2023-10-20 — End: 2023-12-05

## 2023-09-19 NOTE — Progress Notes (Signed)
HPI:      [name] is a 27 y.o. G0P0000 who LMP was Patient's last menstrual period was 08/27/2023 (approximate)., presents today for a problem visit.  She complains of:  Vaginitis: Patient complains of an abnormal vaginal discharge for 2 weeks. Vaginal symptoms include discharge described as malodorous and yellow .Vulvar symptoms include vulvar itching.Other associated symptoms: denies intermenstrual bleeding or spotting after IC. 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   Ashley Yates reports having had BV 3-4 times since this summer. She was last treated for both BV and yeast in August   She has used [employer] sensitive Skin soap for years. Denies douching.   She desires a referral to a PCP as her BP was noted to be high today, she states her BP has been elevated at previous visits.   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 Also,  has a past surgical history that includes Colon surgery; Tonsillectomy (at age 31); Diagnostic laparoscopy (05/22/2013); and Intrauterine device (iud) insertion (09/22/2016)., family history includes Colon cancer (age of onset: 12) in her maternal grandfather; Hyperlipidemia in her father; Hypertension in her father; Kidney Stones in her mother; Polycystic ovary syndrome in her maternal grandmother and mother.,  reports that she has never smoked. She has never used smokeless tobacco. She reports current alcohol use. She reports that she does not use drugs.  She has a current medication list which includes the following prescription(s): collagen-vitamin c-biotin, ibuprofen, metronidazole, [START ON 10/20/2023] metronidazole, multivitamin, probiotic product, tirzepatide, and medroxyprogesterone. Also, is allergic to etodolac.  ROS see HPI  Objective: BP (!) 146/99   Pulse 69   Wt 285 lb 3.2 oz (129.4 kg)   LMP 08/27/2023  (Approximate)   BMI 42.12 kg/m  Physical Exam Constitutional:      Appearance: Normal appearance.  Genitourinary:     Vulva normal.     Genitourinary Comments: Mild irritation at introitus-pt states that is were her lesion was  SSE: not able to fully visualize cerivx, appears to pink and without lesions. Small amount of thick yellow-white discharge present. Mild odor present.   WET prep; slight ph change, slight whiff, few clue cells seen, negative hyphea or trich.   Abdominal:     General: Abdomen is flat.     Tenderness: There is no abdominal tenderness.  Neurological:     Mental Status: She is alert.  Psychiatric:        Mood and Affect: Mood normal.     ASSESSMENT/PLAN:    Problem List Items Addressed This Visit   None Visit Diagnoses     Elevated blood pressure reading    -  Primary   Relevant Orders   Ambulatory referral to [facility]   Vaginal odor       Relevant Orders   Cervicovaginal ancillary only   Mycoplasma / Ureaplasma Culture   Vaginal itching       Relevant Orders   Cervicovaginal ancillary only   BV (bacterial vaginosis)       Relevant Medications   metroNIDAZOLE (FLAGYL) 500 MG tablet   metroNIDAZOLE (METROGEL) 0.75 % vaginal gel (Start on 10/20/2023)       -Will await lab results. IF BV present will treat -PO Metrodianozole x1  week followed by Boric acid x 21 days followed by metro gel as prescribed  -recommend starting Vaginal probiotic   Isabelle Course  [facility]  09/19/23  4:29 PM

## 2023-09-21 LAB — CERVICOVAGINAL ANCILLARY ONLY
Bacterial Vaginitis (gardnerella): NEGATIVE
Candida Glabrata: NEGATIVE
Candida Vaginitis: NEGATIVE
Chlamydia: NEGATIVE
Comment: NEGATIVE
Comment: NEGATIVE
Comment: NEGATIVE
Comment: NEGATIVE
Comment: NEGATIVE
Comment: NORMAL
Neisseria Gonorrhea: NEGATIVE
Trichomonas: NEGATIVE

## 2023-09-21 LAB — MYCOPLASMA / UREAPLASMA CULTURE

## 2023-10-13 ENCOUNTER — Encounter: Payer: Self-pay | Admitting: Licensed Practical Nurse

## 2023-10-15 MED ORDER — FLUCONAZOLE 150 MG PO TABS: 150.0000 mg | ORAL_TABLET | Freq: Once | ORAL | 0 refills | Status: AC

## 2023-10-30 ENCOUNTER — Other Ambulatory Visit: Payer: Self-pay | Admitting: Licensed Practical Nurse

## 2023-10-30 ENCOUNTER — Encounter: Payer: Self-pay | Admitting: Licensed Practical Nurse

## 2023-10-30 DIAGNOSIS — N898 Other specified noninflammatory disorders of vagina: Secondary | ICD-10-CM

## 2023-10-30 NOTE — Progress Notes (Signed)
Pt seen for abnormal discharge on 12/11, swab for mycoplasma was sent but lab unable to do d./t not having the correct agent. Pt notified. Pt will schedule a nurse's only visit to collect new sample.  Order placed Carie Caddy, [place]  [facility]  10/30/23  4:54 PM

## 2023-11-01 ENCOUNTER — Telehealth: Payer: Self-pay | Admitting: Physician Assistant

## 2023-11-01 ENCOUNTER — Ambulatory Visit: Payer: Self-pay | Admitting: Physician Assistant

## 2023-11-01 NOTE — Telephone Encounter (Signed)
Left message that provider is out of office sick and needs to reschedule appt

## 2023-11-02 ENCOUNTER — Ambulatory Visit (INDEPENDENT_AMBULATORY_CARE_PROVIDER_SITE_OTHER): Payer: Self-pay

## 2023-11-02 VITALS — BP 136/85 | HR 76 | Ht 69.0 in | Wt 282.0 lb

## 2023-11-02 DIAGNOSIS — N898 Other specified noninflammatory disorders of vagina: Secondary | ICD-10-CM

## 2023-11-02 NOTE — Progress Notes (Signed)
    NURSE VISIT NOTE  Subjective:    Patient ID: Ashley Yates, female    DOB: 1996-07-23, 28 y.o.   MRN: [id number]  HPI  Patient is a 28 y.o. G0P0000 female who presents for milky vaginal discharge for 2 months. Denies abnormal vaginal bleeding or significant pelvic pain or fever. denies dysuria, hematuria, urinary frequency, urinary urgency, flank pain, abdominal pain, pelvic pain, cloudy malordorous urine, genital rash, and genital irritation. Patient denies history of known exposure to STD.   Objective:    BP 136/85   Pulse 76   Ht 5\' 9"  (1.753 m)   Wt 282 lb (127.9 kg)   LMP 10/03/2023   BMI 41.64 kg/m    @THIS  VISIT ONLY@  Assessment:   1. Vaginal discharge   2. Vaginal odor       Plan:   mycoplasma swab GC and chlamydia DNA  probe sent to lab. Treatment: abstain from coitus during course of treatment ROV prn if symptoms persist or worsen.   Cornelius Moras, CMA

## 2023-11-06 LAB — NUSWAB VG PLUS+MYCOPLASMAS,NAA
Candida albicans, NAA: NEGATIVE
Candida glabrata, NAA: NEGATIVE
Chlamydia trachomatis, NAA: NEGATIVE
Mycoplasma genitalium NAA: NEGATIVE
Mycoplasma hominis NAA: NEGATIVE
Neisseria gonorrhoeae, NAA: NEGATIVE
Trich vag by NAA: NEGATIVE
Ureaplasma spp NAA: POSITIVE — AB

## 2023-11-07 ENCOUNTER — Other Ambulatory Visit: Payer: Self-pay

## 2023-11-07 DIAGNOSIS — Z2239 Carrier of other specified bacterial diseases: Secondary | ICD-10-CM

## 2023-11-07 MED ORDER — DOXYCYCLINE HYCLATE 100 MG PO CAPS
100.0000 mg | ORAL_CAPSULE | Freq: Two times a day (BID) | ORAL | 0 refills | Status: AC
Start: 2023-11-07 — End: 2023-11-14

## 2023-11-07 MED ORDER — DOXYCYCLINE HYCLATE 100 MG PO CAPS
100.0000 mg | ORAL_CAPSULE | Freq: Two times a day (BID) | ORAL | 0 refills | Status: DC
Start: 2023-11-07 — End: 2023-11-07

## 2023-12-01 ENCOUNTER — Encounter: Payer: Self-pay | Admitting: Licensed Practical Nurse

## 2023-12-05 ENCOUNTER — Other Ambulatory Visit: Payer: Self-pay | Admitting: Licensed Practical Nurse

## 2023-12-05 DIAGNOSIS — B9689 Other specified bacterial agents as the cause of diseases classified elsewhere: Secondary | ICD-10-CM

## 2023-12-05 MED ORDER — METRONIDAZOLE 0.75 % VA GEL
1.0000 | Freq: Every day | VAGINAL | 5 refills | Status: AC
Start: 2023-12-05 — End: ?

## 2023-12-05 NOTE — Progress Notes (Signed)
 Pt seen multiple times for BV, recently treated for ureaplasma with Doxy. Continues to have symptoms. Will treat as recurrent BV script for metro gel sent  Jannifer Hick  [facility]  12/05/23  10:47 AM

## 2023-12-10 ENCOUNTER — Ambulatory Visit (INDEPENDENT_AMBULATORY_CARE_PROVIDER_SITE_OTHER): Payer: Self-pay | Admitting: Physician Assistant

## 2023-12-10 ENCOUNTER — Encounter: Payer: Self-pay | Admitting: Physician Assistant

## 2023-12-10 VITALS — BP 147/72 | HR 60 | Ht 69.0 in | Wt 281.0 lb

## 2023-12-10 DIAGNOSIS — E7849 Other hyperlipidemia: Secondary | ICD-10-CM

## 2023-12-10 DIAGNOSIS — F419 Anxiety disorder, unspecified: Secondary | ICD-10-CM

## 2023-12-10 DIAGNOSIS — Z7689 Persons encountering health services in other specified circumstances: Secondary | ICD-10-CM

## 2023-12-10 DIAGNOSIS — I10 Essential (primary) hypertension: Secondary | ICD-10-CM

## 2023-12-10 DIAGNOSIS — Z9189 Other specified personal risk factors, not elsewhere classified: Secondary | ICD-10-CM

## 2023-12-10 DIAGNOSIS — E8881 Metabolic syndrome: Secondary | ICD-10-CM

## 2023-12-10 MED ORDER — FLUOXETINE HCL 10 MG PO TABS: 10.0000 mg | ORAL_TABLET | Freq: Every day | ORAL | 3 refills | Status: DC

## 2023-12-10 MED ORDER — VALSARTAN 40 MG PO TABS
40.0000 mg | ORAL_TABLET | Freq: Every day | ORAL | 3 refills | Status: AC
Start: 2023-12-10 — End: ?

## 2023-12-10 NOTE — Progress Notes (Signed)
 New patient visit  Patient: Ashley Yates   DOB: 1996-01-19   28 y.o. Female  MRN: [id number] Visit Date: 12/10/2023  Today's healthcare provider: Debera Lat, PA-C   Chief Complaint  Patient presents with   new offce visit    Bp concerns, Mood    Subjective    Ashley Yates is a 28 y.o. female who presents today as a new patient to establish care.   Discussed the use of AI scribe software for clinical note transcription with the patient, who gave verbal consent to proceed.  History of Present Illness   The patient, with a past medical history of anxiety and high blood pressure, presents with worsening anxiety over the past year. The patient describes the anxiety as severe, leading to feelings of being unable to "get a grip" on herself. The anxiety is triggered by even the smallest things and has led to episodes of feeling like she might pass out at work. The patient denies any depressive symptoms.  The patient also reports a history of high blood pressure, which was previously managed with propranolol. However, the medication was discontinued as it was not effective in controlling the blood pressure. The patient also reports episodes of blurry vision, which she describes as shadowed and affecting both near and far vision.  The patient has been actively trying to lose weight and has lost 40 pounds through dieting and exercise, which includes weight training and walking three times a week. The patient also reports a history of alcohol consumption, with an intake of five to ten drinks of liquor or wine two days a week, usually on weekends.        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   Right tube diagnosed on Laparoscopy   Past Surgical History:  Procedure Laterality Date   COLON SURGERY     Exploratory surgery and colonscopy    DIAGNOSTIC LAPAROSCOPY  05/22/2013   INTRAUTERINE DEVICE (IUD) INSERTION  09/22/2016    TONSILLECTOMY  at age 36   Family Status  Relation Name Status   Mother  Alive   Father  Alive   Brother  Alive   MGM  (Not Specified)   MGF  (Not Specified)  No partnership data on file   Family History  Problem Relation Age of Onset   Kidney Stones Mother    Polycystic ovary syndrome Mother    Hypertension Father    Hyperlipidemia Father    Polycystic ovary syndrome Maternal Grandmother    Colon cancer Maternal Grandfather 37   Social History   Socioeconomic History   Marital status: Single    Spouse name: Not on file   Number of children: Not on file   Years of education: Not on file   Highest education level: Not on file  Occupational History   Not on file  Tobacco Use   Smoking status: Never   Smokeless tobacco: Never  Vaping Use   Vaping status: Never Used  Substance and Sexual Activity   Alcohol use: Yes    Comment: occasionally   Drug use: No   Sexual activity: Yes    Birth control/protection: I.U.D.  Other Topics Concern   Not on file  Social History Narrative   Not on file   Social Drivers of Health   Financial Resource Strain: Patient Declined (12/09/2023)   Overall Financial Resource Strain (CARDIA)    Difficulty of Paying Living Expenses: Patient declined  Food Insecurity: Patient Declined (12/09/2023)   Hunger Vital Sign    Worried About Running Out of Food in the Last Year: Patient declined    Ran Out of Food in the Last Year: Patient declined  Transportation Needs: No Transportation Needs (12/09/2023)   PRAPARE - Administrator, Civil Service (Medical): No    Lack of Transportation (Non-Medical): No  Physical Activity: Insufficiently Active (12/09/2023)   Exercise Vital Sign    Days of Exercise per Week: 3 days    Minutes of Exercise per Session: 30 min  Stress: Stress Concern Present (12/09/2023)   [employer] of Occupational Health - Occupational Stress Questionnaire    Feeling of Stress : Very much  Social Connections:  Unknown (12/09/2023)   Social Connection and Isolation Panel [NHANES]    Frequency of Communication with Friends and Family: More than three times a week    Frequency of Social Gatherings with Friends and Family: Twice a week    Attends Religious Services: Patient declined    Database administrator or Organizations: No    Attends Engineer, structural: Not on file    Marital Status: Never married   Outpatient Medications Prior to Visit  Medication Sig   Collagen-Vitamin C-Biotin (COLLAGEN 1500/C PO) Take by mouth.   ibuprofen (ADVIL) 100 MG tablet Take 100 mg by mouth every 6 (six) hours as needed for fever.   medroxyPROGESTERone (PROVERA) 10 MG tablet Take 1 tablet (10 mg total) by mouth daily for 7 days.   metroNIDAZOLE (METROGEL) 0.75 % vaginal gel Place 1 Applicatorful vaginally at bedtime. Apply one applicatorful to vagina at bedtime for 10 days, then twice a week for 6 months.   Multiple Vitamin (MULTIVITAMIN) tablet Take 1 tablet by mouth daily.   Probiotic Product (PROBIOTIC-10 PO) Take by mouth.   Tirzepatide [facility]) Inject into the skin.   No facility-administered medications prior to visit.   Allergies  Allergen Reactions   Etodolac Hives and Swelling     There is no immunization history on file for this patient.  Health Maintenance  Topic Date Due   HIV Screening  Never done   Hepatitis C Screening  Never done   DTaP/Tdap/Td (1 - Tdap) Never done   COVID-19 Vaccine (1 - 2024-25 season) Never done   INFLUENZA VACCINE  01/07/2024 (Originally 05/10/2023)   Cervical Cancer Screening (Pap smear)  05/20/2026   HPV VACCINES  Aged Out    Patient Care Team: Debera Lat, PA-C as PCP - General (Physician Assistant)  Review of Systems  All other systems reviewed and are negative.  Except see HPI        Objective    BP (!) 147/72 (BP Location: Right Wrist, Cuff Size: Normal)   Pulse 60   Ht 5\' 9"  (1.753 m)   Wt 281 lb (127.5 kg)   SpO2 100%   BMI  41.50 kg/m      Physical Exam Vitals reviewed.  Constitutional:      General: She is not in acute distress.    Appearance: Normal appearance. She is well-developed. She is obese. She is not diaphoretic.  HENT:     Head: Normocephalic and atraumatic.  Eyes:     General: No scleral icterus.    Extraocular Movements: Extraocular movements intact.     Conjunctiva/sclera: Conjunctivae normal.     Pupils: Pupils are equal, round, and reactive to light.  Neck:     Thyroid: No thyromegaly.  Cardiovascular:  Rate and Rhythm: Normal rate and regular rhythm.     Pulses: Normal pulses.     Heart sounds: Normal heart sounds. No murmur heard. Pulmonary:     Effort: Pulmonary effort is normal. No respiratory distress.     Breath sounds: Normal breath sounds. No wheezing, rhonchi or rales.  Musculoskeletal:     Cervical back: Neck supple.     Right lower leg: No edema.     Left lower leg: No edema.  Lymphadenopathy:     Cervical: No cervical adenopathy.  Skin:    General: Skin is warm and dry.     Findings: No rash.  Neurological:     Mental Status: She is alert and oriented to person, place, and time. Mental status is at baseline.  Psychiatric:        Mood and Affect: Mood normal.        Behavior: Behavior normal.     Depression Screen    12/10/2023   10:14 AM  PHQ 2/9 Scores  PHQ - 2 Score 1  PHQ- 9 Score 5   No results found for any visits on 12/10/23.  Assessment & Plan     1. Metabolic syndrome (Primary) Chronic, associated with htn, anxiety, hld, at risk alcohol consumption Advised treatment of  htn, hld, avoidance of alcohol , treatment of anxiety Weight loss of 5% of pt's current weight via healthy diet and daily exercise encouraged. Will proceed with blood work at the follow-up Pt is self-paid. Will follow-up  Hypertension, unspecified type - valsartan (DIOVAN) 40 MG tablet; Take 1 tablet (40 mg total) by mouth daily.  Dispense: 90 tablet; Refill:  3  Anxiety - FLUoxetine (PROZAC) 10 MG tablet; Take 1 tablet (10 mg total) by mouth daily.  Dispense: 90 tablet; Refill: 3  Anxiety x 1 year gad7 score of 18 Severe anxiety with significant impact on daily life. No current medication. History of using Xanax and Klonopin. -Start on a basic antidepressant, with a focus on weight-neutral options due to patient's weight loss efforts. -Start with a small dose and monitor for side effects. -Plan to reassess in one month.  Hypertension History of high blood pressure, currently unmedicated. -Start on a general blood pressure medication/valsartan 40. -Start with half a tablet, monitor for side effects, then increase to one tablet if tolerated. -Advise patient to monitor blood pressure at home, morning and evening. -Encourage dietary changes (low sodium, DASH diet) and continued exercise. -Plan to reassess in one month.  Hyperlipidemia History of high cholesterol from blood work three years ago. -Plan to repeat blood work at next visit.  Alcohol Use Reports drinking 5-10 servings of alcohol 2 days per week. -Advise to decrease alcohol intake due to potential risks.  General Health Maintenance -Advise tetanus shot if not updated within the last 10 years. -Plan to reassess in one month.      Encounter to establish care Welcomed to our clinic Reviewed past medical hx, social hx, family hx and surgical hx Pt advised to send all vaccination records or screening   Return in about 4 weeks (around 01/07/2024) for BP f/u, chronic disease f/u.    The patient was advised to call back or seek an in-person evaluation if the symptoms worsen or if the condition fails to improve as anticipated.  I discussed the assessment and treatment plan with the patient. The patient was provided an opportunity to ask questions and all were answered. The patient agreed with the plan and demonstrated an understanding  of the instructions.  I, Debera Lat, PA-C  have reviewed all documentation for this visit. The documentation on  12/10/2023   for the exam, diagnosis, procedures, and orders are all accurate and complete.  Debera Lat, [facility], MMS [facility] [phone] (phone) [phone] (fax)  [facility]

## 2024-01-06 NOTE — Progress Notes (Deleted)
 Established patient visit  Patient: Ashley Yates   DOB: January 27, 1996   28 y.o. Female  MRN: [id number] Visit Date: 01/07/2024  Today's healthcare provider: Debera Lat, PA-C   No chief complaint on file.  Subjective       Discussed the use of AI scribe software for clinical note transcription with the patient, who gave verbal consent to proceed.  History of Present Illness        12/10/2023   10:14 AM  Depression screen PHQ 2/9  Decreased Interest 0  Down, Depressed, Hopeless 1  PHQ - 2 Score 1  Altered sleeping 3  Tired, decreased energy 1  Change in appetite 0  Feeling bad or failure about yourself  0  Trouble concentrating 0  Moving slowly or fidgety/restless 0  Suicidal thoughts 0  PHQ-9 Score 5  Difficult doing work/chores Not difficult at all      12/10/2023   10:15 AM  GAD 7 : Generalized Anxiety Score  Nervous, Anxious, on Edge 3  Control/stop worrying 3  Worry too much - different things 3  Trouble relaxing 3  Restless 2  Easily annoyed or irritable 3  Afraid - awful might happen 1  Total GAD 7 Score 18  Anxiety Difficulty Very difficult    Medications: Outpatient Medications Prior to Visit  Medication Sig  . Collagen-Vitamin C-Biotin (COLLAGEN 1500/C PO) Take by mouth.  Marland Kitchen FLUoxetine (PROZAC) 10 MG tablet Take 1 tablet (10 mg total) by mouth daily.  Marland Kitchen ibuprofen (ADVIL) 100 MG tablet Take 100 mg by mouth every 6 (six) hours as needed for fever.  . medroxyPROGESTERone (PROVERA) 10 MG tablet Take 1 tablet (10 mg total) by mouth daily for 7 days.  . metroNIDAZOLE (METROGEL) 0.75 % vaginal gel Place 1 Applicatorful vaginally at bedtime. Apply one applicatorful to vagina at bedtime for 10 days, then twice a week for 6 months.  . Multiple Vitamin (MULTIVITAMIN) tablet Take 1 tablet by mouth daily.  . Probiotic Product (PROBIOTIC-10 PO) Take by mouth.  . Tirzepatide [facility]) Inject into the skin.  . valsartan (DIOVAN) 40 MG tablet Take 1 tablet (40 mg  total) by mouth daily.   No facility-administered medications prior to visit.    Review of Systems  All other systems reviewed and are negative. All negative Except see HPI   {Insert previous labs (optional):23779} {See past labs  Heme  Chem  Endocrine  Serology  Results Review (optional):1}   Objective    There were no vitals taken for this visit. {Insert last BP/Wt (optional):23777}{See vitals history (optional):1}   Physical Exam Vitals reviewed.  Constitutional:      General: She is not in acute distress.    Appearance: Normal appearance. She is well-developed. She is not diaphoretic.  HENT:     Head: Normocephalic and atraumatic.  Eyes:     General: No scleral icterus.    Conjunctiva/sclera: Conjunctivae normal.  Neck:     Thyroid: No thyromegaly.  Cardiovascular:     Rate and Rhythm: Normal rate and regular rhythm.     Pulses: Normal pulses.     Heart sounds: Normal heart sounds. No murmur heard. Pulmonary:     Effort: Pulmonary effort is normal. No respiratory distress.     Breath sounds: Normal breath sounds. No wheezing, rhonchi or rales.  Musculoskeletal:     Cervical back: Neck supple.     Right lower leg: No edema.     Left lower leg: No edema.  Lymphadenopathy:  Cervical: No cervical adenopathy.  Skin:    General: Skin is warm and dry.     Findings: No rash.  Neurological:     Mental Status: She is alert and oriented to person, place, and time. Mental status is at baseline.  Psychiatric:        Mood and Affect: Mood normal.        Behavior: Behavior normal.     No results found for any visits on 01/07/24.      Assessment and Plan Assessment & Plan     No orders of the defined types were placed in this encounter.   No follow-ups on file.   The patient was advised to call back or seek an in-person evaluation if the symptoms worsen or if the condition fails to improve as anticipated.  I discussed the assessment and treatment plan  with the patient. The patient was provided an opportunity to ask questions and all were answered. The patient agreed with the plan and demonstrated an understanding of the instructions.  I, Debera Lat, PA-C have reviewed all documentation for this visit. The documentation on 01/07/2024  for the exam, diagnosis, procedures, and orders are all accurate and complete.  Debera Lat, [facility], MMS [facility] [phone] (phone) [phone] (fax)  [facility]

## 2024-01-07 ENCOUNTER — Ambulatory Visit: Payer: Self-pay | Admitting: Physician Assistant

## 2024-01-07 DIAGNOSIS — I10 Essential (primary) hypertension: Secondary | ICD-10-CM

## 2024-05-26 ENCOUNTER — Ambulatory Visit (INDEPENDENT_AMBULATORY_CARE_PROVIDER_SITE_OTHER): Payer: Self-pay | Admitting: Obstetrics & Gynecology

## 2024-05-26 ENCOUNTER — Encounter: Payer: Self-pay | Admitting: Obstetrics & Gynecology

## 2024-05-26 VITALS — BP 129/80 | HR 80 | Ht 69.0 in | Wt 284.2 lb

## 2024-05-26 DIAGNOSIS — O26851 Spotting complicating pregnancy, first trimester: Secondary | ICD-10-CM

## 2024-05-26 DIAGNOSIS — Z3490 Encounter for supervision of normal pregnancy, unspecified, unspecified trimester: Secondary | ICD-10-CM

## 2024-05-26 DIAGNOSIS — N939 Abnormal uterine and vaginal bleeding, unspecified: Secondary | ICD-10-CM

## 2024-05-26 NOTE — Progress Notes (Signed)
    GYNECOLOGY PROGRESS NOTE  Subjective:    Patient ID: Ashley Yates, female    DOB: [birth year], 28 y.o.   MRN: [id number]  HPI  Patient is a 28 y.o. single G1 at roughly 5-6 weeks by LMP is here today because she noted spotting 1 week ago. She has had 1 week of midline cramping. She denies unilateral severe pain. She was not using contraception, has endometriosis and says that she was told that she could not conceive. She has not used contraception for 4 years.  The following portions of the patient's history were reviewed and updated as appropriate: allergies, current medications, past family history, past medical history, past social history, past surgical history, and problem list.  Review of Systems Pertinent items are noted in HPI.   Objective:   Blood pressure 129/80, pulse 80, height 5' 9 (1.753 m), weight 284 lb 3.2 oz (128.9 kg), last menstrual period 04/14/2024. Body mass index is 41.97 kg/m. Well nourished, well hydrated White female, no apparent distress She is ambulating and conversing normally. Abd- benign EG- normal Cervix- nulliparous, no blood noted Bimanual exam reveals no masses or pain with deep palpation  Assessment:   H/o spotting, early pregnancy- no exam findings to suggest ectopic  Plan:   She will get TVS asap Pain precautions

## 2024-05-27 ENCOUNTER — Other Ambulatory Visit: Payer: Self-pay

## 2024-05-27 ENCOUNTER — Encounter: Payer: Self-pay | Admitting: Obstetrics & Gynecology

## 2024-05-27 ENCOUNTER — Ambulatory Visit
Admission: RE | Admit: 2024-05-27 | Discharge: 2024-05-27 | Disposition: A | Discharge status: Home / Self Care | Payer: Self-pay | Source: Ambulatory Visit | Attending: Obstetrics & Gynecology | Admitting: Obstetrics & Gynecology

## 2024-05-27 ENCOUNTER — Other Ambulatory Visit: Payer: Self-pay | Admitting: Obstetrics & Gynecology

## 2024-05-27 DIAGNOSIS — O3680X0 Pregnancy with inconclusive fetal viability, not applicable or unspecified: Secondary | ICD-10-CM

## 2024-05-27 DIAGNOSIS — N939 Abnormal uterine and vaginal bleeding, unspecified: Secondary | ICD-10-CM | POA: Insufficient documentation

## 2024-05-27 DIAGNOSIS — Z3A00 Weeks of gestation of pregnancy not specified: Secondary | ICD-10-CM | POA: Insufficient documentation

## 2024-05-27 DIAGNOSIS — O26851 Spotting complicating pregnancy, first trimester: Secondary | ICD-10-CM | POA: Insufficient documentation

## 2024-05-27 DIAGNOSIS — Z3490 Encounter for supervision of normal pregnancy, unspecified, unspecified trimester: Secondary | ICD-10-CM | POA: Insufficient documentation

## 2024-05-27 NOTE — Telephone Encounter (Signed)
 I called and discussed the results She needs Qbhcg today and in 48 hours.

## 2024-05-27 NOTE — Addendum Note (Signed)
 Addended by: TAFT CAMELIA MATSU on: 05/27/2024 03:52 PM   Modules accepted: Orders

## 2024-05-28 LAB — BETA HCG QUANT (REF LAB): hCG Quant: 164 m[IU]/mL

## 2024-05-29 ENCOUNTER — Other Ambulatory Visit: Payer: Self-pay

## 2024-05-29 DIAGNOSIS — O3680X0 Pregnancy with inconclusive fetal viability, not applicable or unspecified: Secondary | ICD-10-CM

## 2024-05-30 ENCOUNTER — Other Ambulatory Visit: Payer: Self-pay

## 2024-05-30 ENCOUNTER — Emergency Department: Payer: Self-pay

## 2024-05-30 ENCOUNTER — Emergency Department
Admission: EM | Admit: 2024-05-30 | Discharge: 2024-05-30 | Disposition: A | Discharge status: Home / Self Care | Payer: Self-pay

## 2024-05-30 ENCOUNTER — Telehealth: Payer: Self-pay

## 2024-05-30 DIAGNOSIS — O3680X0 Pregnancy with inconclusive fetal viability, not applicable or unspecified: Secondary | ICD-10-CM | POA: Insufficient documentation

## 2024-05-30 DIAGNOSIS — Z3A01 Less than 8 weeks gestation of pregnancy: Secondary | ICD-10-CM | POA: Insufficient documentation

## 2024-05-30 LAB — ABO/RH: ABO/RH(D): A POS

## 2024-05-30 LAB — CBC WITH DIFFERENTIAL/PLATELET
Abs Immature Granulocytes: 0.03 K/uL (ref 0.00–0.07)
Basophils Absolute: 0.1 K/uL (ref 0.0–0.1)
Basophils Relative: 1 %
Eosinophils Absolute: 0.3 K/uL (ref 0.0–0.5)
Eosinophils Relative: 4 %
HCT: 35.5 % — ABNORMAL LOW (ref 36.0–46.0)
Hemoglobin: 12 g/dL (ref 12.0–15.0)
Immature Granulocytes: 0 %
Lymphocytes Relative: 27 %
Lymphs Abs: 2 K/uL (ref 0.7–4.0)
MCH: 30.1 pg (ref 26.0–34.0)
MCHC: 33.8 g/dL (ref 30.0–36.0)
MCV: 89 fL (ref 80.0–100.0)
Monocytes Absolute: 0.5 K/uL (ref 0.1–1.0)
Monocytes Relative: 7 %
Neutro Abs: 4.5 K/uL (ref 1.7–7.7)
Neutrophils Relative %: 61 %
Platelets: 314 K/uL (ref 150–400)
RBC: 3.99 MIL/uL (ref 3.87–5.11)
RDW: 13.3 % (ref 11.5–15.5)
WBC: 7.4 K/uL (ref 4.0–10.5)
nRBC: 0 % (ref 0.0–0.2)

## 2024-05-30 LAB — URINALYSIS, ROUTINE W REFLEX MICROSCOPIC
Bilirubin Urine: NEGATIVE
Glucose, UA: NEGATIVE mg/dL
Ketones, ur: NEGATIVE mg/dL
Nitrite: NEGATIVE
Protein, ur: 30 mg/dL — AB
Specific Gravity, Urine: 1.025 (ref 1.005–1.030)
pH: 5 (ref 5.0–8.0)

## 2024-05-30 LAB — POC URINE PREG, ED: Preg Test, Ur: POSITIVE — AB

## 2024-05-30 LAB — BETA HCG QUANT (REF LAB): hCG Quant: 203 m[IU]/mL

## 2024-05-30 LAB — HCG, QUANTITATIVE, PREGNANCY: hCG, Beta Chain, Quant, S: 290 m[IU]/mL — ABNORMAL HIGH (ref <5)

## 2024-05-30 NOTE — Discharge Instructions (Signed)
 Your evaluation in the emergency department was overall reassuring.  Your pregnancy hormones do continue to be elevated though less so than we would anticipate.  It is possible you may have recently had a miscarriage, or it remains possible that you may have a small ectopic pregnancy (though we do not see this on imaging today.  I discussed your case with your OB/GYN clinic and they recommended you going home and having a repeat blood test this coming Monday--they will contact you to schedule this.  Return to the emergency department immediately with any new or worsening symptoms including severe lower abdominal pain, significant bleeding, or any other symptoms concerning to you.

## 2024-05-30 NOTE — ED Triage Notes (Signed)
 Pt comes with vaginal bleeding. Pt states she did home test and it was positive. Pt went to obgyn and they did US  on Tuesday and couldn't see anything. Pt was called today and told to come to ER and that she wasn't pregnant.   Pt states some clotting that started on Tuesday.

## 2024-05-30 NOTE — ED Notes (Signed)
 See triage note  Presents with some vaginal bleeding  States she had a positive home pregnancy test

## 2024-05-30 NOTE — ED Notes (Signed)
 Patient declined discharge vital signs.

## 2024-05-30 NOTE — Telephone Encounter (Signed)
 We discussed her inappropriate rise of QBHCG and ultrasound findings. I advised evaluation in the ED with probable treatment with methotrexate. She is not in pain, has a little spotting/midline cramping. She understands that an ectopic pregnancy can be a lifethreatening condition.

## 2024-05-30 NOTE — ED Provider Notes (Signed)
 [facility] Provider Note    Event Date/Time   First MD Initiated Contact with Patient 05/30/24 1209     (approximate)   History   Vaginal Bleeding  Pt comes with vaginal bleeding. Pt states she did home test and it was positive. Pt went to obgyn and they did US  on Tuesday and couldn't see anything. Pt was called today and told to come to ER and that she wasn't pregnant.   Pt states some clotting that started on Tuesday.    HPI Ashley Yates is a 28 y.o. female G1, P0 at about 6 weeks pregnancy PMH hidradenitis suppurativa, metabolic syndrome presents for evaluation of vaginal bleeding - LMP July 7, not planned desired pregnancy.  First pregnancy.  Notes that she first had some vaginal spotting on 8/11.  Subsequently seen in OB/GYN clinic on 8/18, see summary below.  Then on 8/19 started developing what felt like a regular menstrual cycle.  Did have some moderate suprapubic pain on 8/20 but has essentially resolved. - No ongoing vaginal bleeding at this time, no significant suprapubic/pelvic discomfort - Was told by her OB/GYN to come to emergency department for eval.  Per chart review, seen in clinic by OB/GYN on 05/26/2024.  Had noted some vaginal spotting around 8/11 and has had 1 week of midline abdominal cramping.  Beta-hCG on 05/27/2024 164, 05/29/2024 203, today 290.  US  from 05/27/24: IMPRESSION: Pregnancy of unknown anatomic location (no intrauterine gestational sac or adnexal mass identified). Differential diagnosis includes recent spontaneous abortion, IUP too early to visualize, and non-visualized ectopic pregnancy. Recommend followup of beta-hCG levels, and follow up US  as warranted clinically.     Physical Exam   Triage Vital Signs: ED Triage Vitals  Encounter Vitals Group     BP 05/30/24 1139 (!) 163/99     Girls Systolic BP Percentile --      Girls Diastolic BP Percentile --      Boys Systolic BP Percentile --      Boys Diastolic BP  Percentile --      Pulse Rate 05/30/24 1139 78     Resp 05/30/24 1139 18     Temp 05/30/24 1139 98.3 F (36.8 C)     Temp src --      SpO2 05/30/24 1139 100 %     Weight 05/30/24 1136 284 lb 3.2 oz (128.9 kg)     Height 05/30/24 1136 5' 9 (1.753 m)     Head Circumference --      Peak Flow --      Pain Score 05/30/24 1136 0     Pain Loc --      Pain Education --      Exclude from Growth Chart --     Most recent vital signs: Vitals:   05/30/24 1139  BP: (!) 163/99  Pulse: 78  Resp: 18  Temp: 98.3 F (36.8 C)  SpO2: 100%     General: Awake, no distress.  CV:  Good peripheral perfusion. RRR, RP 2+ Resp:  Normal effort. CTAB Abd:  No distention. Nontender to deep palpation throughout abdomen and anterior pelvis    ED Results / Procedures / Treatments   Labs (all labs ordered are listed, but only abnormal results are displayed) Labs Reviewed  CBC WITH DIFFERENTIAL/PLATELET - Abnormal; Notable for the following components:      Result Value   HCT 35.5 (*)    All other components within normal limits  URINALYSIS, ROUTINE W  REFLEX MICROSCOPIC - Abnormal; Notable for the following components:   Color, Urine YELLOW (*)    APPearance CLOUDY (*)    Hgb urine dipstick LARGE (*)    Protein, ur 30 (*)    Leukocytes,Ua TRACE (*)    Bacteria, UA FEW (*)    All other components within normal limits  HCG, QUANTITATIVE, PREGNANCY - Abnormal; Notable for the following components:   hCG, Beta Chain, Quant, S 290 (*)    All other components within normal limits  POC URINE PREG, ED - Abnormal; Notable for the following components:   Preg Test, Ur POSITIVE (*)    All other components within normal limits  ABO/RH     EKG  N/a   RADIOLOGY Radiology interpreted by myself and radiology report reviewed.  No acute pathology identified, no obvious ectopic.  Pregnancy of unknown location.    PROCEDURES:  Critical Care performed: No  Procedures   MEDICATIONS ORDERED IN  ED: Medications - No data to display   IMPRESSION / MDM / ASSESSMENT AND PLAN / ED COURSE  I reviewed the triage vital signs and the nursing notes.                              DDX/MDM/AP: Differential diagnosis includes, but is not limited to, ectopic pregnancy, consider spontaneous abortion or incomplete abortion.  Plan: - Labs - Pelvic ultrasound - N.p.o. - Reassess  Patient's presentation is most consistent with acute presentation with potential threat to life or bodily function.   ED course below.  Workup with mildly rising hCG, repeat ultrasound again with no visualized ectopic and no visualized IUP, remains pregnancy of unknown location.  Discussed with OB/GYN who agrees with interpretation of possible spontaneous abortion versus persistent pregnancy even on location.  No evidence of ectopic rupture at this time, no pain, no lateralizing symptoms, no ongoing vaginal bleeding-OB/GYN recommends outpatient follow-up, they will recheck hCG in 3 days on Monday.  Strict ED return precautions discussed.  Patient agrees with plan.  Clinical Course as of 05/30/24 1855  Fri May 30, 2024  1331 CBC reviewed, unremarkable Urinalysis contaminated, no clear evidence of infection Urine hCG positive, quantitative hCG 290 [MM]  1547 US : IMPRESSION: Pregnancy of unknown anatomic location (no intrauterine gestational sac or adnexal mass identified). Differential diagnosis includes recent spontaneous abortion, IUP too early to visualize, and non-visualized ectopic pregnancy. Recommend follow up US  as warranted clinically.   [MM]  1553 D/w Harlene Cisco CNM of [facility] - Agrees possible recent spontaneous abortion versus nonvisualized ectopic pregnancy -Given that she is having no significant pain and no lateralizing symptoms, recommends discharge home with trending of hCG this coming Monday (today is Friday) and strict ED return precautions for the interim. [MM]    Clinical Course  User Index [MM] Clarine Ozell LABOR, MD     FINAL CLINICAL IMPRESSION(S) / ED DIAGNOSES   Final diagnoses:  Pregnancy of unknown anatomic location     Rx / DC Orders   ED Discharge Orders     None        Note:  This document was prepared using Dragon voice recognition software and may include unintentional dictation errors.   Clarine Ozell LABOR, MD 05/30/24 [phone]

## 2024-05-30 NOTE — Telephone Encounter (Signed)
 Patient called about her beta results. She had beta done on 08/19 and it resulted 164. She had another beta done on 08/21 and it resulted 203. Looks like her levels are increasing. Patient has been having bleeding and clotting. She has been having some mild cramping. Had some cramping that was severe, but only last about 1 minute. I advised patient to continue to monitor her bleeding and if she starts soaking a pad every 30 minutes to 1 hour with severe cramping to go to ED. Otherwise she would here back from you soon about next steps.

## 2024-06-02 ENCOUNTER — Encounter: Payer: Self-pay | Admitting: Certified Nurse Midwife

## 2024-06-02 ENCOUNTER — Ambulatory Visit (INDEPENDENT_AMBULATORY_CARE_PROVIDER_SITE_OTHER): Payer: Self-pay | Admitting: Certified Nurse Midwife

## 2024-06-02 VITALS — BP 140/89 | HR 67 | Wt 288.5 lb

## 2024-06-02 DIAGNOSIS — Z3A00 Weeks of gestation of pregnancy not specified: Secondary | ICD-10-CM

## 2024-06-02 DIAGNOSIS — O039 Complete or unspecified spontaneous abortion without complication: Secondary | ICD-10-CM

## 2024-06-02 NOTE — Progress Notes (Signed)
 Patient, No Pcp Per   Chief Complaint  Patient presents with   ER Follow Up    HPI:      Ashley Yates is a 28 y.o. G1P0010 whose LMP was Patient's last menstrual period was 04/14/2024 (exact date)., presents today for follow up after ED visit for miscarriage/pregnancy of unknown location. She reports 3 days of bleeding-heavy period amount with clots and tissue. Now vaginal spotting only. She reports occasional cramping & tenderness-more to right side. Denies severe pain or constant pain, denies foul odor to discharge.    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/2014   Adiposity 06/23/2014   Adenosalpingitis 06/23/2014   History of ovarian cyst 06/23/2014   Endometriosis 05/19/2014    Past Surgical History:  Procedure Laterality Date   COLON SURGERY     Exploratory surgery and colonscopy    DIAGNOSTIC LAPAROSCOPY  05/22/2013   INTRAUTERINE DEVICE (IUD) INSERTION  09/22/2016   TONSILLECTOMY  at age 76    Family History  Problem Relation Age of Onset   Kidney Stones Mother    Polycystic ovary syndrome Mother    Hypertension Mother    Obesity Mother    Hypertension Father    Hyperlipidemia Father    Cancer Father    Obesity Father    Polycystic ovary syndrome Maternal Grandmother    Hypertension Maternal Grandmother    Obesity Maternal Grandmother    Colon cancer Maternal Grandfather 42   Cancer Maternal Grandfather    Hypertension Paternal Grandfather    Obesity Paternal Grandfather    Stroke Paternal Grandfather    Diabetes Paternal Grandmother    Hypertension  Paternal Grandmother    Obesity Paternal Grandmother     Social History   Socioeconomic History   Marital status: Single    Spouse name: Not on file   Number of children: Not on file   Years of education: Not on file   Highest education level: Not on file  Occupational History   Not on file  Tobacco Use   Smoking status: Never   Smokeless tobacco: Never  Vaping Use   Vaping status: Never Used  Substance and Sexual Activity   Alcohol use: Yes    Comment: occasionally   Drug use: No   Sexual activity: Yes  Other Topics Concern   Not on file  Social History Narrative   Not on file   Social Drivers of Health   Financial Resource Strain: Patient Declined (12/09/2023)   Overall Financial Resource Strain (CARDIA)    Difficulty of Paying Living Expenses: Patient declined  Food Insecurity: Patient Declined (12/09/2023)   Hunger Vital Sign    Worried About Running Out of Food in the Last Year: Patient declined    Ran Out of Food in the Last Year: Patient declined  Transportation Needs: No Transportation Needs (12/09/2023)   PRAPARE - Administrator, Civil Service (Medical): No    Lack of Transportation (Non-Medical): No  Physical Activity: Insufficiently Active (12/09/2023)   Exercise Vital Sign    Days of Exercise per Week: 3 days    Minutes  of Exercise per Session: 30 min  Stress: Stress Concern Present (12/09/2023)   [employer] of Occupational Health - Occupational Stress Questionnaire    Feeling of Stress : Very much  Social Connections: Unknown (12/09/2023)   Social Connection and Isolation Panel    Frequency of Communication with Friends and Family: More than three times a week    Frequency of Social Gatherings with Friends and Family: Twice a week    Attends Religious Services: Patient declined    Database administrator or Organizations: No    Attends Engineer, structural: Not on file    Marital Status: Never married  Intimate Partner Violence: Not  on file    Outpatient Medications Prior to Visit  Medication Sig Dispense Refill   Collagen-Vitamin C-Biotin (COLLAGEN 1500/C PO) Take by mouth. (Patient not taking: Reported on 06/02/2024)     FLUoxetine  (PROZAC ) 10 MG tablet Take 1 tablet (10 mg total) by mouth daily. (Patient not taking: Reported on 06/02/2024) 90 tablet 3   ibuprofen (ADVIL) 100 MG tablet Take 100 mg by mouth every 6 (six) hours as needed for fever. (Patient not taking: Reported on 06/02/2024)     medroxyPROGESTERone  (PROVERA ) 10 MG tablet Take 1 tablet (10 mg total) by mouth daily for 7 days. (Patient not taking: Reported on 06/02/2024) 7 tablet 0   metroNIDAZOLE  (METROGEL ) 0.75 % vaginal gel Place 1 Applicatorful vaginally at bedtime. Apply one applicatorful to vagina at bedtime for 10 days, then twice a week for 6 months. (Patient not taking: Reported on 06/02/2024) 70 g 5   Multiple Vitamin (MULTIVITAMIN) tablet Take 1 tablet by mouth daily. (Patient not taking: Reported on 06/02/2024)     Probiotic Product (PROBIOTIC-10 PO) Take by mouth. (Patient not taking: Reported on 06/02/2024)     Tirzepatide (MOUNJARO ) Inject into the skin. (Patient not taking: Reported on 06/02/2024)     valsartan  (DIOVAN ) 40 MG tablet Take 1 tablet (40 mg total) by mouth daily. (Patient not taking: Reported on 06/02/2024) 90 tablet 3   No facility-administered medications prior to visit.      ROS:  Review of Systems  Constitutional:  Negative for chills and fever.  Respiratory: Negative.    Cardiovascular: Negative.   Genitourinary:  Positive for vaginal bleeding.     OBJECTIVE:   Vitals:  BP (!) 140/89   Pulse 67   Wt 288 lb 8 oz (130.9 kg)   LMP 04/14/2024 (Exact Date)   BMI 42.60 kg/m   Physical Exam Vitals reviewed.  Constitutional:      Appearance: Normal appearance.  Cardiovascular:     Rate and Rhythm: Normal rate.  Pulmonary:     Effort: Pulmonary effort is normal.  Abdominal:     Palpations: Abdomen is soft.      Tenderness: There is abdominal tenderness in the right lower quadrant.     Comments: RLQ tenderness on deep palpation.  Skin:    General: Skin is warm and dry.  Neurological:     Mental Status: She is alert.     Results: No results found for this or any previous visit (from the past 24 hours). Recent Results (from the past 2160 hours)  Beta hCG quant (ref lab)     Status: None   Collection Time: 05/27/24  3:58 PM  Result Value Ref Range   hCG Quant 164 mIU/mL    Comment:  Female (Non-pregnant)    0 -     5                             (Postmenopausal)  0 -     8                      Female (Pregnant)                      Weeks of Gestation                              3                6 -    71                              4               10 -   750                              5              217 -  7138                              6              158 - 31795                              7             [phone] -[phone]                              8            32065 -[phone]                              0            [phone]                             87            [phone] -[phone]                             14            13950 - [phone]  16             9040 - 56451                             17             8175 - [phone]                             18             8099 - 58176 Roche E CLIA methodology   Beta hCG quant (ref lab)     Status: None   Collection Time: 05/29/24 11:07 AM  Result Value Ref Range   hCG Quant 203 mIU/mL    Comment:                      Female (Non-pregnant)    0 -     5                             (Postmenopausal)  0 -     8                      Female (Pregnant)                      Weeks of Gestation                              3                6 -    71                              4                10 -   750                              5              217 -  7138                              6              158 - 31795                              7             3697 -[phone]                              8            32065 -850428                              9            [phone]  10            46509 -813022                             12            27832 -[phone]                             14            13950 - 62530                             15            12039 - 70971                             16             9040 - 56451                             17             [phone] - [phone]                             18             8099 - 58176 Roche E CLIA methodology   CBC with Differential/Platelet     Status: Abnormal   Collection Time: 05/30/24 11:38 AM  Result Value Ref Range   WBC 7.4 4.0 - 10.5 K/uL   RBC 3.99 3.87 - 5.11 MIL/uL   Hemoglobin 12.0 12.0 - 15.0 g/dL   HCT 64.4 (L) 63.9 - 53.9 %   MCV 89.0 80.0 - 100.0 fL   MCH 30.1 26.0 - 34.0 pg   MCHC 33.8 30.0 - 36.0 g/dL   RDW 86.6 88.4 - 84.4 %   Platelets 314 150 - 400 K/uL   nRBC 0.0 0.0 - 0.2 %   Neutrophils Relative % 61 %   Neutro Abs 4.5 1.7 - 7.7 K/uL   Lymphocytes Relative 27 %   Lymphs Abs 2.0 0.7 - 4.0 K/uL   Monocytes Relative 7 %   Monocytes Absolute 0.5 0.1 - 1.0 K/uL   Eosinophils Relative 4 %   Eosinophils Absolute 0.3 0.0 - 0.5 K/uL   Basophils Relative 1 %   Basophils Absolute 0.1 0.0 - 0.1 K/uL   Immature Granulocytes 0 %   Abs Immature Granulocytes 0.03 0.00 - 0.07 K/uL    Comment: Performed at [facility], [address] [place], [place] [address]  Urinalysis, Routine w reflex microscopic -Urine, Random     Status: Abnormal   Collection Time: 05/30/24 11:38 AM  Result Value Ref Range   Color, Urine YELLOW (A) YELLOW   APPearance CLOUDY (A) CLEAR   Specific Gravity, Urine 1.025 1.005 - 1.030   pH 5.0 5.0 - 8.0   Glucose, UA NEGATIVE NEGATIVE  mg/dL   Hgb urine dipstick LARGE (A) NEGATIVE   Bilirubin Urine NEGATIVE NEGATIVE   Ketones, ur NEGATIVE NEGATIVE mg/dL   Protein, ur 30 (A) NEGATIVE mg/dL   Nitrite NEGATIVE NEGATIVE   Leukocytes,Ua TRACE (A) NEGATIVE   RBC / HPF 0-5 0 -  5 RBC/hpf   WBC, UA 0-5 0 - 5 WBC/hpf   Bacteria, UA FEW (A) NONE SEEN   Squamous Epithelial / HPF 11-20 0 - 5 /HPF   Mucus PRESENT     Comment: Performed at [facility], [address] [place], [place] [address]  hCG, quantitative, pregnancy     Status: Abnormal   Collection Time: 05/30/24 11:38 AM  Result Value Ref Range   hCG, Beta Chain, Quant, S 290 (H) <5 mIU/mL    Comment:          GEST. AGE      CONC.  (mIU/mL)   <=1 WEEK        5 - 50     2 WEEKS       50 - 500     3 WEEKS       100 - 10,000     4 WEEKS     1,000 - 30,000     5 WEEKS     3,500 - 115,000   6-8 WEEKS     12,000 - 270,000    12 WEEKS     15,000 - 220,000        FEMALE AND NON-PREGNANT FEMALE:     LESS THAN 5 mIU/mL Performed at [facility], [address] [place], [place] [address]   ABO/Rh     Status: None   Collection Time: 05/30/24 11:38 AM  Result Value Ref Range   ABO/RH(D)      A POS Performed at [facility], [address] [place], [place] [address]   POC urine preg, ED     Status: Abnormal   Collection Time: 05/30/24 11:47 AM  Result Value Ref Range   Preg Test, Ur POSITIVE (A) NEGATIVE    Comment:        THE SENSITIVITY OF THIS METHODOLOGY IS >20 mIU/mL.      Assessment/Plan: Miscarriage - Plan: Human Chorionic Gonadotropin  (hCG),Quantitative (Serial Monitor)  Follow Hcg, consider methotrexate  if not decreasing. Options for contraception reviewed, condoms/abstinence for now. Will restart Diovan  & Prozac .  No orders of the defined types were placed in this encounter.    Harlene LITTIE Cisco, CNM 06/02/2024 9:06 AM

## 2024-06-03 ENCOUNTER — Other Ambulatory Visit: Payer: Self-pay

## 2024-06-03 ENCOUNTER — Encounter: Payer: Self-pay | Admitting: Certified Nurse Midwife

## 2024-06-03 ENCOUNTER — Encounter: Payer: Self-pay | Admitting: Emergency Medicine

## 2024-06-03 ENCOUNTER — Emergency Department
Admission: EM | Admit: 2024-06-03 | Discharge: 2024-06-03 | Disposition: A | Discharge status: Home / Self Care | Payer: Self-pay | Attending: Emergency Medicine | Admitting: Emergency Medicine

## 2024-06-03 ENCOUNTER — Other Ambulatory Visit: Payer: Self-pay | Admitting: Obstetrics

## 2024-06-03 DIAGNOSIS — R112 Nausea with vomiting, unspecified: Secondary | ICD-10-CM

## 2024-06-03 DIAGNOSIS — O2691 Pregnancy related conditions, unspecified, first trimester: Secondary | ICD-10-CM

## 2024-06-03 DIAGNOSIS — O039 Complete or unspecified spontaneous abortion without complication: Secondary | ICD-10-CM

## 2024-06-03 DIAGNOSIS — O10911 Unspecified pre-existing hypertension complicating pregnancy, first trimester: Secondary | ICD-10-CM | POA: Insufficient documentation

## 2024-06-03 DIAGNOSIS — O009 Unspecified ectopic pregnancy without intrauterine pregnancy: Secondary | ICD-10-CM

## 2024-06-03 DIAGNOSIS — Z3A01 Less than 8 weeks gestation of pregnancy: Secondary | ICD-10-CM

## 2024-06-03 DIAGNOSIS — O26891 Other specified pregnancy related conditions, first trimester: Secondary | ICD-10-CM | POA: Insufficient documentation

## 2024-06-03 DIAGNOSIS — R1031 Right lower quadrant pain: Secondary | ICD-10-CM | POA: Insufficient documentation

## 2024-06-03 LAB — COMPREHENSIVE METABOLIC PANEL WITH GFR
ALT: 14 U/L (ref 0–44)
AST: 18 U/L (ref 15–41)
Albumin: 4 g/dL (ref 3.5–5.0)
Alkaline Phosphatase: 38 U/L (ref 38–126)
Anion gap: 11 (ref 5–15)
BUN: 8 mg/dL (ref 6–20)
CO2: 19 mmol/L — ABNORMAL LOW (ref 22–32)
Calcium: 8.8 mg/dL — ABNORMAL LOW (ref 8.9–10.3)
Chloride: 107 mmol/L (ref 98–111)
Creatinine, Ser: 0.74 mg/dL (ref 0.44–1.00)
GFR, Estimated: >60 mL/min (ref >60)
Glucose, Bld: 99 mg/dL (ref 70–99)
Potassium: 4 mmol/L (ref 3.5–5.1)
Sodium: 137 mmol/L (ref 135–145)
Total Bilirubin: 0.8 mg/dL (ref 0.0–1.2)
Total Protein: 7.6 g/dL (ref 6.5–8.1)

## 2024-06-03 LAB — CBC WITH DIFFERENTIAL/PLATELET
Abs Immature Granulocytes: 0.03 K/uL (ref 0.00–0.07)
Basophils Absolute: 0.1 K/uL (ref 0.0–0.1)
Basophils Relative: 1 %
Eosinophils Absolute: 0.3 K/uL (ref 0.0–0.5)
Eosinophils Relative: 5 %
HCT: 36.7 % (ref 36.0–46.0)
Hemoglobin: 12.3 g/dL (ref 12.0–15.0)
Immature Granulocytes: 0 %
Lymphocytes Relative: 28 %
Lymphs Abs: 2 K/uL (ref 0.7–4.0)
MCH: 30.1 pg (ref 26.0–34.0)
MCHC: 33.5 g/dL (ref 30.0–36.0)
MCV: 89.7 fL (ref 80.0–100.0)
Monocytes Absolute: 0.5 K/uL (ref 0.1–1.0)
Monocytes Relative: 7 %
Neutro Abs: 4.1 K/uL (ref 1.7–7.7)
Neutrophils Relative %: 59 %
Platelets: 314 K/uL (ref 150–400)
RBC: 4.09 MIL/uL (ref 3.87–5.11)
RDW: 13.2 % (ref 11.5–15.5)
WBC: 7.1 K/uL (ref 4.0–10.5)
nRBC: 0 % (ref 0.0–0.2)

## 2024-06-03 LAB — HUMAN CHORIONIC GONADOTROPIN(HCG),B-SUBUNIT,QUANTITATIVE): HCG, Beta Chain, Quant, S: 294 m[IU]/mL

## 2024-06-03 LAB — ABO/RH: ABO/RH(D): A POS

## 2024-06-03 LAB — HCG, QUANTITATIVE, PREGNANCY: hCG, Beta Chain, Quant, S: 406 m[IU]/mL — ABNORMAL HIGH (ref <5)

## 2024-06-03 MED ORDER — METHOTREXATE FOR ECTOPIC PREGNANCY
50.0000 mg/m2 | Freq: Once | INTRAMUSCULAR | Status: AC
  Administered 2024-06-03: 125 mg via INTRAMUSCULAR
  Filled 2024-06-03: qty 5

## 2024-06-03 NOTE — ED Notes (Signed)
 Midwife in with pt

## 2024-06-03 NOTE — ED Provider Notes (Signed)
 [facility] Provider Note    Event Date/Time   First MD Initiated Contact with Patient 06/03/24 1312     (approximate)   History   Chief Complaint Possible Pregnancy   HPI  Ashley Yates is a 28 y.o. female G1P0 at approximately 7 weeks of pregnancy with past medical history of hypertension and endometriosis who presents to the ED complaining of abdominal pain.  Patient reports that she initially had a positive pregnancy test last Sunday, developed some vaginal bleeding for 3 days later in the week.  She was seen in the ED at that time with ultrasound that showed pregnancy of unknown location.  Since then, bleeding has resolved and she has been following with her OB/GYN to trend beta-hCG levels.  These levels have remained around 290 on recheck with no downtrending and so she was sent to the ED today for possible dose of methotrexate .  She does report some mild ongoing right lower quadrant abdominal pain as well as light vaginal spotting.     Physical Exam   Triage Vital Signs: ED Triage Vitals [06/03/24 1222]  Encounter Vitals Group     BP (!) 156/118     Girls Systolic BP Percentile      Girls Diastolic BP Percentile      Boys Systolic BP Percentile      Boys Diastolic BP Percentile      Pulse Rate 81     Resp 18     Temp 98.3 F (36.8 C)     Temp Source Oral     SpO2 100 %     Weight 280 lb (127 kg)     Height 5' 9 (1.753 m)     Head Circumference      Peak Flow      Pain Score 4     Pain Loc      Pain Education      Exclude from Growth Chart     Most recent vital signs: Vitals:   06/03/24 1222  BP: (!) 156/118  Pulse: 81  Resp: 18  Temp: 98.3 F (36.8 C)  SpO2: 100%    Constitutional: Alert and oriented. Eyes: Conjunctivae are normal. Head: Atraumatic. Nose: No congestion/rhinnorhea. Mouth/Throat: Mucous membranes are moist.  Cardiovascular: Normal rate, regular rhythm. Grossly normal heart sounds.  2+ radial pulses  bilaterally. Respiratory: Normal respiratory effort.  No retractions. Lungs CTAB. Gastrointestinal: Soft and mildly tender to palpation of the right lower quadrant with no rebound or guarding. No distention. Musculoskeletal: No lower extremity tenderness nor edema.  Neurologic:  Normal speech and language. No gross focal neurologic deficits are appreciated.    ED Results / Procedures / Treatments   Labs (all labs ordered are listed, but only abnormal results are displayed) Labs Reviewed  HCG, QUANTITATIVE, PREGNANCY - Abnormal; Notable for the following components:      Result Value   hCG, Beta Chain, Quant, S 406 (*)    All other components within normal limits  COMPREHENSIVE METABOLIC PANEL WITH GFR - Abnormal; Notable for the following components:   CO2 19 (*)    Calcium 8.8 (*)    All other components within normal limits  CBC WITH DIFFERENTIAL/PLATELET  ABO/RH    PROCEDURES:  Critical Care performed: No  Procedures   MEDICATIONS ORDERED IN ED: Medications  methotrexate  (for ectopic pregnancy) 25 mg/mL chemo injection (125 mg Intramuscular Given 06/03/24 1544)     IMPRESSION / MDM / ASSESSMENT AND PLAN / ED  COURSE  I reviewed the triage vital signs and the nursing notes.                              28 y.o. female G1, P0 at approximately 7 weeks of pregnancy who presents to the ED complaining of right lower quadrant abdominal pain with flat hCG levels on outpatient check.  Patient's presentation is most consistent with acute presentation with potential threat to life or bodily function.  Differential diagnosis includes, but is not limited to, ectopic pregnancy, incomplete miscarriage, pregnancy of unknown anatomic location, anemia, electrolyte abnormality, AKI.  Patient nontoxic-appearing and in no acute distress, vital signs remarkable for hypertension but otherwise reassuring.  Her abdomen is soft but she does have some slight tenderness to palpation in the right  lower quadrant, beta-hCG level slightly uptrending from previous at 406 today compared to 294 yesterday.  Additional labs without significant anemia, leukocytosis, electrolyte abnormality, or AKI, patient is Rh+ and there is no indication for RhoGAM.  Case discussed with CNM Cowen of OB/GYN, who will discuss need for ultrasound with her attending.  Case again discussed with CNM Duwayne, who had spoken with Dr. Leigh of OB/GYN.  They we will place order for methotrexate  but do not feel repeat ultrasound needed at this time, which is reasonable given very low suspicion for ruptured ectopic with her reassuring vital signs, labs, and abdominal exam.  Appointments have been made for repeat beta-hCG levels in 3 and 6 days from now, patient also counseled to return to the ED for any new or worsening symptoms.  Patient agrees with plan.      FINAL CLINICAL IMPRESSION(S) / ED DIAGNOSES   Final diagnoses:  Abnormal pregnancy in first trimester     Rx / DC Orders   ED Discharge Orders     None        Note:  This document was prepared using Dragon voice recognition software and may include unintentional dictation errors.   Willo Dunnings, MD 06/03/24 [phone]

## 2024-06-03 NOTE — ED Triage Notes (Signed)
 Patient to ED via POV per OBGYN. PT reports miscarriage but with rising HCG levels. Sent to ED for methotrexate  injection. Having RLQ abd pain with spotting.

## 2024-06-03 NOTE — ED Notes (Signed)
 See triage note  Presents from [facility]   States she was seen for possible miscarriage

## 2024-06-03 NOTE — Progress Notes (Signed)
 CNM contacted by ED re: pt with abd pain, imaging, and rising hCG consistent with ectopic and opting for Methotrexate . Order for MTX placed, will be given in [facility] ED. Clinic will arrange repeat hCG on days 4 and 7, possibly 8 due to holiday. HCGs ordered and pt scheduled.

## 2024-06-03 NOTE — Consult Note (Cosign Needed Addendum)
 OB/Triage Note  Patient ID: Ashley Yates MRN: [id number] DOB/AGE: [birth year] 28 y.o.  Subjective  History of Present Illness: The patient is a 28 y.o. female G1P0010 at [facility] by exact LMP. Sent over from clinic to receive methotrexate  for concerns of ectopic pregnancy. Last week Ashley Yates experienced several days of heavy bleeding including clots and tissue. Currently is only spotting. Also reported right lower abdominal pain, although pain is not severe. Has now had two u/s on 8/19 and 8/22, both with no intrauterine gestational sac or adnexal mass identified. Has also had serial beta hcgs which have slowly increased although are well below expected values for [redacted] weeks EGA. Recent hcgs are as followed: 290 four days ago, 291 1 day ago, 406 today. Ashley Yates denies feeling lightheaded, dizzy, current heavy bleeding.   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   Current Outpatient Medications on File Prior to Encounter  Medication Sig Dispense Refill   Collagen-Vitamin C-Biotin (COLLAGEN 1500/C PO) Take by mouth. (Patient not taking: Reported on 06/02/2024)     FLUoxetine  (PROZAC ) 10 MG tablet Take 1 tablet (10 mg total) by mouth daily. (Patient not taking: Reported on 06/02/2024) 90 tablet 3   ibuprofen (ADVIL) 100 MG tablet Take 100 mg by mouth every 6 (six) hours as needed for fever. (Patient not taking: Reported on 06/02/2024)     medroxyPROGESTERone  (PROVERA ) 10 MG tablet Take 1 tablet (10 mg total) by mouth daily for 7 days. (Patient not taking: Reported on 06/02/2024)  7 tablet 0   metroNIDAZOLE  (METROGEL ) 0.75 % vaginal gel Place 1 Applicatorful vaginally at bedtime. Apply one applicatorful to vagina at bedtime for 10 days, then twice a week for 6 months. (Patient not taking: Reported on 06/02/2024) 70 g 5   Multiple Vitamin (MULTIVITAMIN) tablet Take 1 tablet by mouth daily. (Patient not taking: Reported on 06/02/2024)     Probiotic Product (PROBIOTIC-10 PO) Take by mouth. (Patient not taking: Reported on 06/02/2024)     Tirzepatide (MOUNJARO [place]) Inject into the skin. (Patient not taking: Reported on 06/02/2024)     valsartan  (DIOVAN ) 40 MG tablet Take 1 tablet (40 mg total) by mouth daily. (Patient not taking: Reported on 06/02/2024) 90 tablet 3    Allergies  Allergen Reactions   Etodolac Hives and Swelling    Social History   Socioeconomic History   Marital status: Single    Spouse name: Not on file   Number of children: Not on file   Years of education: Not on file   Highest education level: Not on file  Occupational History   Not on file  Tobacco Use   Smoking status: Never   Smokeless tobacco: Never  Vaping Use   Vaping status: Never Used  Substance and Sexual Activity   Alcohol use: Yes    Comment: occasionally   Drug use: No   Sexual activity: Yes  Other Topics Concern   Not on file  Social History Narrative   Not on file   Social  Drivers of Health   Financial Resource Strain: Patient Declined (12/09/2023)   Overall Financial Resource Strain (CARDIA)    Difficulty of Paying Living Expenses: Patient declined  Food Insecurity: Patient Declined (12/09/2023)   Hunger Vital Sign    Worried About Running Out of Food in the Last Year: Patient declined    Ran Out of Food in the Last Year: Patient declined  Transportation Needs: No Transportation Needs (12/09/2023)   PRAPARE - Administrator, Civil Service (Medical): No    Lack of Transportation (Non-Medical): No  Physical Activity: Insufficiently Active (12/09/2023)   Exercise  Vital Sign    Days of Exercise per Week: 3 days    Minutes of Exercise per Session: 30 min  Stress: Stress Concern Present (12/09/2023)   Ashley Yates [employer] Occupational Health - Occupational Stress Questionnaire    Feeling of Stress : Very much  Social Connections: Unknown (12/09/2023)   Social Connection and Isolation Panel    Frequency of Communication with Friends and Family: More than three times a week    Frequency of Social Gatherings with Friends and Family: Twice a week    Attends Religious Services: Patient declined    Database administrator or Organizations: No    Attends Engineer, structural: Not on file    Marital Status: Never married  Catering manager Violence: Not on file    Family History  Problem Relation Age of Onset   Kidney Stones Mother    Polycystic ovary syndrome Mother    Hypertension Mother    Obesity Mother    Hypertension Father    Hyperlipidemia Father    Cancer Father    Obesity Father    Polycystic ovary syndrome Maternal Grandmother    Hypertension Maternal Grandmother    Obesity Maternal Grandmother    Colon cancer Maternal Grandfather 42   Cancer Maternal Grandfather    Hypertension Paternal Grandfather    Obesity Paternal Grandfather    Stroke Paternal Grandfather    Diabetes Paternal Grandmother    Hypertension Paternal Grandmother    Obesity Paternal Grandmother      ROS    Objective  Physical Exam: BP (!) 156/118   Pulse 81   Temp 98.3 F (36.8 C) (Oral)   Resp 18   Ht 5' 9 (1.753 m)   Wt 127 kg   LMP 04/14/2024 (Exact Date)   SpO2 100%   BMI 41.35 kg/m     Abd: soft, no masses, RLQ tender on palpation   Hospital Course: Consulted by Dr. Valeda in the ER. Spoke with Dr. Estil Mangle regarding plan of care. Methotrexate  is indicated due to concerns for ectopic pregnancy. Will also require beta hcg levels to drawn in clinic this Friday and Tuesday. Bleeding precautions discussed. Education about avoiding NSAIDs,  folic acid, need for serial beta hcgs, bleeding precautions and avoiding pregnancy for at least 90 days after receiving methotrexate .   Assessment: 28 y.o. female G1P0010 at 7wk1d EGA Ectopic pregnancy vs incomplete SAB Hemodynamically stable, no concerns for ruptured fallopian tube Rh positive- does not require rhogam  Plan: Methotrexate  ordered and to be administered before discharge Serial beta hcgs to be completed on Friday and Tuesday in clinic, already ordered and scheduled with patient Counseled to avoid NSAIDs, avoid folic acid, bleeding precautions, avoid pregnancy for at least 90 days CMP to be collected Take tylenol  as needed for pain All discussed with care team and patient     Total time spent taking  care of this patient: 60 minutes  Signed: Lolita Loots CNM, FNP 06/03/2024, 2:49 PM

## 2024-06-04 MED ORDER — ONDANSETRON 4 MG PO TBDP: 4.0000 mg | ORAL_TABLET | Freq: Three times a day (TID) | ORAL | 0 refills | Status: AC | PRN

## 2024-06-05 ENCOUNTER — Emergency Department
Admission: EM | Admit: 2024-06-05 | Discharge: 2024-06-05 | Disposition: A | Discharge status: Home / Self Care | Payer: Self-pay | Attending: Emergency Medicine | Admitting: Emergency Medicine

## 2024-06-05 ENCOUNTER — Emergency Department: Payer: Self-pay

## 2024-06-05 DIAGNOSIS — R1031 Right lower quadrant pain: Secondary | ICD-10-CM | POA: Insufficient documentation

## 2024-06-05 DIAGNOSIS — O26891 Other specified pregnancy related conditions, first trimester: Secondary | ICD-10-CM | POA: Insufficient documentation

## 2024-06-05 DIAGNOSIS — R102 Pelvic and perineal pain: Secondary | ICD-10-CM | POA: Insufficient documentation

## 2024-06-05 DIAGNOSIS — Z3A01 Less than 8 weeks gestation of pregnancy: Secondary | ICD-10-CM

## 2024-06-05 DIAGNOSIS — O3680X0 Pregnancy with inconclusive fetal viability, not applicable or unspecified: Secondary | ICD-10-CM

## 2024-06-05 DIAGNOSIS — O039 Complete or unspecified spontaneous abortion without complication: Secondary | ICD-10-CM

## 2024-06-05 LAB — CBC
HCT: 36.7 % (ref 36.0–46.0)
Hemoglobin: 12 g/dL (ref 12.0–15.0)
MCH: 29.6 pg (ref 26.0–34.0)
MCHC: 32.7 g/dL (ref 30.0–36.0)
MCV: 90.4 fL (ref 80.0–100.0)
Platelets: 326 K/uL (ref 150–400)
RBC: 4.06 MIL/uL (ref 3.87–5.11)
RDW: 13.3 % (ref 11.5–15.5)
WBC: 7 K/uL (ref 4.0–10.5)
nRBC: 0 % (ref 0.0–0.2)

## 2024-06-05 LAB — HCG, QUANTITATIVE, PREGNANCY: hCG, Beta Chain, Quant, S: 332 m[IU]/mL — ABNORMAL HIGH (ref <5)

## 2024-06-05 MED ORDER — FENTANYL CITRATE PF 50 MCG/ML IJ SOSY
50.0000 ug | PREFILLED_SYRINGE | Freq: Once | INTRAMUSCULAR | Status: AC
  Administered 2024-06-05: 50 ug via INTRAVENOUS
  Filled 2024-06-05: qty 1

## 2024-06-05 MED ORDER — OXYCODONE-ACETAMINOPHEN 5-325 MG PO TABS: 1.0000 | ORAL_TABLET | Freq: Three times a day (TID) | ORAL | 0 refills | Status: DC | PRN

## 2024-06-05 NOTE — ED Provider Notes (Signed)
 [facility] Provider Note    Event Date/Time   First MD Initiated Contact with Patient 06/05/24 1335     (approximate)   History   Pelvic Pain   HPI  Ashley Yates is a 28 y.o. female who just received methotrexate  2 days ago for pregnancy of unknown location, she reports increasing right sided pelvic cramping and pain, no vaginal bleeding     Physical Exam   Triage Vital Signs: ED Triage Vitals  Encounter Vitals Group     BP 06/05/24 1328 (!) 175/97     Girls Systolic BP Percentile --      Girls Diastolic BP Percentile --      Boys Systolic BP Percentile --      Boys Diastolic BP Percentile --      Pulse Rate 06/05/24 1328 (!) 55     Resp 06/05/24 1328 18     Temp 06/05/24 1328 98.3 F (36.8 C)     Temp src --      SpO2 06/05/24 1328 98 %     Weight --      Height --      Head Circumference --      Peak Flow --      Pain Score 06/05/24 1327 8     Pain Loc --      Pain Education --      Exclude from Growth Chart --     Most recent vital signs: Vitals:   06/05/24 1328 06/05/24 1711  BP: (!) 175/97 (!) 137/94  Pulse: (!) 55 63  Resp: 18 18  Temp: 98.3 F (36.8 C) 97.9 F (36.6 C)  SpO2: 98% 100%     General: Awake, no distress.  CV:  Good peripheral perfusion.  Resp:  Normal effort.  Abd:  No distention.  Mild tenderness right lower abdomen Other:     ED Results / Procedures / Treatments   Labs (all labs ordered are listed, but only abnormal results are displayed) Labs Reviewed  HCG, QUANTITATIVE, PREGNANCY - Abnormal; Notable for the following components:      Result Value   hCG, Beta Chain, Quant, S 332 (*)    All other components within normal limits  CBC     EKG     RADIOLOGY Ultrasound pending    PROCEDURES:  Critical Care performed:   Procedures   MEDICATIONS ORDERED IN ED: Medications  fentaNYL  (SUBLIMAZE ) injection 50 mcg (50 mcg Intravenous Given 06/05/24 1431)  fentaNYL  (SUBLIMAZE )  injection 50 mcg (50 mcg Intravenous Given 06/05/24 1701)     IMPRESSION / MDM / ASSESSMENT AND PLAN / ED COURSE  I reviewed the triage vital signs and the nursing notes. Patient's presentation is most consistent with acute presentation with potential threat to life or bodily function.  Patient presents with pelvic pain as detailed above, differential includes pain from miscarriage, less likely ruptured ectopic, will obtain ultrasound, labs, treat with IV fentanyl  and reevaluate.  Ultrasound is reassuring, lab work is unremarkable, patient seen by midwife who recommends increased pain medication, very close follow-up for repeat hCGs in their clinic      FINAL CLINICAL IMPRESSION(S) / ED DIAGNOSES   Final diagnoses:  Pregnancy of unknown anatomic location     Rx / DC Orders   ED Discharge Orders          Ordered    oxyCODONE -acetaminophen  (PERCOCET) 5-325 MG tablet  Every 8 hours PRN  06/05/24 1831             Note:  This document was prepared using Dragon voice recognition software and may include unintentional dictation errors.   Arlander [place], MD 06/05/24 [phone]

## 2024-06-05 NOTE — Discharge Instructions (Addendum)
 Please follow-up with OB as detailed during the last visit, they still need to check your hormone levels on the same days

## 2024-06-05 NOTE — Consult Note (Signed)
   ER consult Note   ER consult Note   28 y.o. G1P0 at [facility] by LMP. Skylynne received methotrexate  on 06/03/24 for concerns of ectopic pregnancy. Last week Zayneb experienced several days of heavy bleeding including clots and tissue. Currently is not bleeding. Also reported right lower abdominal pain, although pain earlier in the week was more mild, now is severe. Has now had two u/s on 8/19 and 8/22, both with no intrauterine gestational sac or adnexal mass identified. Has also had serial beta hcgs which have slowly increased although are well below expected values for [redacted] weeks EGA. Recent hcgs are as followed: 290 four days ago, 291 1 day ago, 406 today. Sundra denies feeling lightheaded, dizzy, current heavy bleeding.    Subjective:  Kennetha started with more severe abdominal pain yesterday morning. Then this morning it was very severe. Rated pain 8/10, did not improve with tylenol . The pain is was brought her into the ER again. She was worried that potentially her fallopian tube had ruptured. In the ER she had another hcg level drawn which was 332 (now starting to trend downward) and a repeat u/s which was unremarkable. Her pain level is now a five after two doses of fentanyl .  Objective:  BP (!) 137/94 (BP Location: Right Arm)   Pulse 63   Temp 97.9 F (36.6 C) (Oral)   Resp 18   LMP 04/14/2024 (Exact Date)   SpO2 100%   Abd: soft, non distended, non tender   Assessment  Ectopic pregnancy vs incomplete spontaneous abortion Hcg's starting to trend downward Hemodynamically stable Pain improved VSS    Plan:   1. Consulted with Dr. Fredirick regarding plan of care. Pain is most likely secondary to involution of pregnancy. Will have patient discharge home with Rx for percocet to help handle pain over next few days. 2. Instructed patient to keep appointments for repeat hcg lab draws in clinic for both tomorrow and Tuesday 3. Discussed with both Damien and Dr. Arlander  All discussed with  patient  Lolita Loots CNM, FNP [facility] 06/05/2024  6:14 PM

## 2024-06-05 NOTE — ED Triage Notes (Addendum)
 PT had methotrexate  injection on Tuesday for miscarriage. PT states pain is excruciating and wanted to make sure nothing else is wrong. Denies any vaginal bleeding. Has taken 4 OTC tylenol  this morning

## 2024-06-06 ENCOUNTER — Other Ambulatory Visit: Payer: Self-pay

## 2024-06-06 DIAGNOSIS — O009 Unspecified ectopic pregnancy without intrauterine pregnancy: Secondary | ICD-10-CM

## 2024-06-07 LAB — BETA HCG QUANT (REF LAB): hCG Quant: 279 m[IU]/mL

## 2024-06-10 ENCOUNTER — Other Ambulatory Visit: Payer: Self-pay

## 2024-06-10 DIAGNOSIS — O009 Unspecified ectopic pregnancy without intrauterine pregnancy: Secondary | ICD-10-CM

## 2024-06-11 ENCOUNTER — Ambulatory Visit: Payer: Self-pay | Admitting: Obstetrics

## 2024-06-11 ENCOUNTER — Other Ambulatory Visit: Payer: Self-pay | Admitting: Obstetrics

## 2024-06-11 ENCOUNTER — Encounter: Payer: Self-pay | Admitting: Obstetrics & Gynecology

## 2024-06-11 DIAGNOSIS — O009 Unspecified ectopic pregnancy without intrauterine pregnancy: Secondary | ICD-10-CM

## 2024-06-11 LAB — BETA HCG QUANT (REF LAB): hCG Quant: 90 m[IU]/mL

## 2024-06-11 NOTE — Progress Notes (Signed)
 Standing order for future hCG

## 2024-06-12 NOTE — Telephone Encounter (Signed)
-----   Message from Estil Mangle sent at 06/12/2024  8:08 AM EDT ----- Rockie, please notify pt of MyChart message and should repeat hCG next week. Thanks! ----- Message ----- From: Rebecka Memos Lab Results In Sent: 06/11/2024   5:36 AM EDT To: Estil Mangle, MD

## 2024-06-12 NOTE — Telephone Encounter (Signed)
 Attempted to call patient to relay Dr. Marchia message about falling beta hcg levels.  No answer.  Left voicemail asking patient to check her MyChart messages or call back to discuss.

## 2024-06-13 ENCOUNTER — Telehealth: Payer: Self-pay

## 2024-06-13 NOTE — Telephone Encounter (Signed)
 2nd attempt to reach patient by phone.  No answer.  VM left asking her to return call to clinic or check MyChart messages.  Pt needs beta hcg draws weekly until <5.  Order has been placed.

## 2024-06-17 ENCOUNTER — Telehealth: Payer: Self-pay

## 2024-06-17 NOTE — Telephone Encounter (Signed)
 3rd attempt to reach patient by phone.  No answer LVM asking her to return our call or read her MyChart messages.  Pt needs beta hcg draws weekly until <5.  Order has been placed.

## 2024-06-18 ENCOUNTER — Other Ambulatory Visit: Payer: Self-pay | Admitting: Obstetrics & Gynecology

## 2024-06-18 ENCOUNTER — Encounter: Payer: Self-pay | Admitting: Obstetrics & Gynecology

## 2024-06-18 DIAGNOSIS — O009 Unspecified ectopic pregnancy without intrauterine pregnancy: Secondary | ICD-10-CM

## 2024-06-18 NOTE — Progress Notes (Signed)
 Unable to reach patient x 4 attempts.  Patient has accessed and read her MyChart messages.  Case closed.

## 2024-06-23 ENCOUNTER — Encounter: Payer: Self-pay | Admitting: Obstetrics & Gynecology

## 2024-07-01 ENCOUNTER — Ambulatory Visit: Payer: Self-pay | Admitting: Obstetrics & Gynecology

## 2024-07-01 NOTE — Telephone Encounter (Signed)
 I called to remind her to get her [facility]. I left a voicemail.

## 2024-08-14 ENCOUNTER — Ambulatory Visit (INDEPENDENT_AMBULATORY_CARE_PROVIDER_SITE_OTHER): Payer: Self-pay | Admitting: Advanced Practice Midwife

## 2024-08-14 ENCOUNTER — Other Ambulatory Visit (HOSPITAL_COMMUNITY)
Admission: RE | Admit: 2024-08-14 | Discharge: 2024-08-14 | Disposition: A | Discharge status: Home / Self Care | Payer: Self-pay | Source: Ambulatory Visit | Attending: Advanced Practice Midwife | Admitting: Advanced Practice Midwife

## 2024-08-14 ENCOUNTER — Encounter: Payer: Self-pay | Admitting: Advanced Practice Midwife

## 2024-08-14 VITALS — BP 124/80 | HR 72 | Ht 69.0 in | Wt 279.0 lb

## 2024-08-14 DIAGNOSIS — B009 Herpesviral infection, unspecified: Secondary | ICD-10-CM

## 2024-08-14 DIAGNOSIS — N898 Other specified noninflammatory disorders of vagina: Secondary | ICD-10-CM

## 2024-08-14 MED ORDER — VALACYCLOVIR HCL 1 G PO TABS: 1000.0000 mg | ORAL_TABLET | Freq: Every day | ORAL | 11 refills | Status: DC

## 2024-08-14 MED ORDER — VALACYCLOVIR HCL 1 G PO TABS: 1000.0000 mg | ORAL_TABLET | Freq: Every day | ORAL | 6 refills | Status: AC

## 2024-08-14 MED ORDER — FLUCONAZOLE 150 MG PO TABS: 150.0000 mg | ORAL_TABLET | Freq: Once | ORAL | 1 refills | Status: AC

## 2024-08-14 NOTE — Progress Notes (Signed)
 Patient ID: Ashley Yates, female   DOB: [birth year], 28 y.o.   MRN: [id number]  Reason for Visit: Vaginitis   Subjective:  HPI:  Ashley Yates is a 28 y.o. female presents today for evaluation of recurrent bacterial vaginosis. She has been getting BV on and off x 2 years. She has been using metrogel  for treatment. Current symptoms are white discharge and itching along with a musky odor. Discussed that she may have a yeast infection and metro gel will not treat that. Aptima swab today. Reviewed symptoms and comfort measures/medications for vaginitis. She uses Tide pods for laundry and [employer]. Recommend using hypoallergenic and fragrance free products. Cotton underwear. Limit simple carbohydrate intake if yeast infection. Will send Rx for Diflucan .   She also has concerns about possible retained products following a miscarriage in August of this year. She noticed something that looked like tissue when wiping. From picture it looks like endometrial lining shedding. She is not currently sexually active and has used condoms in the past. She is not planning a pregnancy.  She request refill of Valtrex  Rx 1,000 mg.  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 tube diagnosed on Laparoscopy   Family History  Problem Relation Age of Onset   Kidney Stones Mother    Polycystic ovary syndrome Mother    Hypertension Mother    Obesity Mother    Hypertension Father    Hyperlipidemia Father    Cancer Father    Obesity Father    Polycystic ovary syndrome Maternal Grandmother    Hypertension Maternal Grandmother    Obesity Maternal Grandmother    Colon cancer Maternal Grandfather 63   Cancer Maternal Grandfather    Hypertension Paternal Grandfather    Obesity Paternal Grandfather    Stroke Paternal Grandfather    Diabetes Paternal Grandmother    Hypertension Paternal Grandmother    Obesity  Paternal Grandmother    Past Surgical History:  Procedure Laterality Date   COLON SURGERY     Exploratory surgery and colonscopy    DIAGNOSTIC LAPAROSCOPY  05/22/2013   INTRAUTERINE DEVICE (IUD) INSERTION  09/22/2016   TONSILLECTOMY  at age 40    Short Social History:  Social History   Tobacco Use   Smoking status: Never   Smokeless tobacco: Never  Substance Use Topics   Alcohol use: Yes    Comment: occasionally    Allergies  Allergen Reactions   Etodolac Hives and Swelling    Current Outpatient Medications  Medication Sig Dispense Refill   fluconazole  (DIFLUCAN ) 150 MG tablet Take 1 tablet (150 mg total) by mouth once for 1 dose. Can take additional dose three days later if symptoms persist 1 tablet 1   FLUoxetine  (PROZAC ) 10 MG tablet Take 1 tablet (10 mg total) by mouth daily. 90 tablet 3   metroNIDAZOLE  (METROGEL ) 0.75 % vaginal gel Place 1 Applicatorful vaginally at bedtime. Apply one applicatorful to vagina at bedtime for 10 days, then twice a week for 6 months. 70 g 5   ondansetron  (ZOFRAN -ODT) 4 MG disintegrating tablet Take 1 tablet (4 mg total) by mouth every 8 (eight) hours as needed for nausea or vomiting. 30 tablet 0   Probiotic Product (PROBIOTIC-10 PO) Take by mouth.     Tirzepatide (MOUNJARO [place]) Inject into the skin.     valACYclovir  (VALTREX ) 1000 MG tablet Take  1 tablet (1,000 mg total) by mouth daily. 30 tablet 11   valsartan  (DIOVAN ) 40 MG tablet Take 1 tablet (40 mg total) by mouth daily. 90 tablet 3   No current facility-administered medications for this visit.    Review of Systems  Constitutional:  Negative for chills and fever.  HENT:  Negative for congestion, ear discharge, ear pain, hearing loss, sinus pain and sore throat.   Eyes:  Negative for blurred vision and double vision.  Respiratory:  Negative for cough, shortness of breath and wheezing.   Cardiovascular:  Negative for chest pain, palpitations and leg swelling.  Gastrointestinal:   Negative for abdominal pain, blood in stool, constipation, diarrhea, heartburn, melena, nausea and vomiting.  Genitourinary:  Negative for dysuria, flank pain, frequency, hematuria and urgency.       Positive for vaginal discharge, odor, itching, and solid tissue discharge during period  Musculoskeletal:  Negative for back pain, joint pain and myalgias.  Skin:  Negative for itching and rash.  Neurological:  Negative for dizziness, tingling, tremors, sensory change, speech change, focal weakness, seizures, loss of consciousness, weakness and headaches.  Endo/Heme/Allergies:  Negative for environmental allergies. Does not bruise/bleed easily.  Psychiatric/Behavioral:  Negative for depression, hallucinations, memory loss, substance abuse and suicidal ideas. The patient is not nervous/anxious and does not have insomnia.        Objective:  Objective   Vitals:   08/14/24 0922  BP: 124/80  Pulse: 72  Weight: 279 lb (126.6 kg)  Height: 5' 9 (1.753 m)   Body mass index is 41.2 kg/m. Constitutional: obese female in no acute distress.  HEENT: normal Skin: Warm and dry.  Cardiovascular: Regular rate and rhythm.   Extremity: no edema  Respiratory:  Normal respiratory effort Psych: Alert and Oriented x3. No memory deficits. Normal mood and affect.    Pelvic exam:  is not limited by body habitus EGBUS: within normal limits Vagina: within normal limits, normal mucosa, scant blood, some clumpy white discharge    Assessment/Plan:     28 y.o. female with possible yeast infection  Aptima swab Diflucan  Rx Vaginitis comfort measures Boric acid suppositories as prevention Rx Valtrex , episodic use   Slater Rains, CNM [facility] 08/14/2024 12:05 PM

## 2024-08-14 NOTE — Patient Instructions (Signed)
 Irritation of the Vagina (Vaginitis): What to Know  Vaginitis is irritation and swelling of the vagina. It happens when the usual balance of bacteria and yeast in the vagina changes. This change causes some types to grow too much. This overgrowth leads to vaginitis. What are the causes? Bacteria. Yeast, which is a fungus. A parasite. A virus. Low hormones in the body. This can occur during pregnancy, breastfeeding, or after menopause. What increases the risk? Irritants, such as douches, bubble baths, scented tampons, and feminine sprays. Antibiotics. Poor hygiene. Wearing tight pants or thong underwear. Some birth control methods, such as diaphragms, vaginal sponges, or spermicides. Having sex without a condom or having sex with more than one person. Infections. Uncontrolled diabetes. What are the signs or symptoms? Abnormal fluid from the vagina. The fluid may be: White, gray, or yellow. Thick, white, and cheesy. Frothy and yellow or green. A bad smell from the vagina. Itching, pain, or swelling in the vagina. Pain during sex. Pain or burning when you pee. How is this diagnosed? This condition is diagnosed based on your symptoms, medical history, and an exam. This may include a pelvic exam. Tests may also be done. Tests may be done to: Check the pH level of your vagina. Check the fluid in your vagina. How is this treated? Treatment will depend on what is causing your vaginitis. Treatment may include: Antibiotics. Antifungal medicines. Medicines to treat symptoms if you have a virus. Your sex partner should also be treated. Estrogen medicines. Medicines to treat allergies. The medicines may be pills or creams. Follow these instructions at home: Lifestyle Keep the area around your vagina clean and dry. Avoid using soap. Rinse the area with water. Until your health care provider says it's okay: Do not douche. Do not use tampons. Use pads, if needed. Do not have sex. Wipe  from front to back after going to the bathroom. When the provider says it's okay, practice safe sex. Use condoms. General instructions Take your medicines only as told. If you were given antibiotics, take them as told. Do not stop taking them even if you start to feel better. How is this prevented? Use mild, unscented products. Avoid the following products if they are scented: Sprays. Detergents. Tampons. Products for cleaning the vagina. Soaps or bubble baths. Let air reach your genital area. To do this: Wear cotton underwear. Do not wear underwear while you sleep. Do not wear tight pants and underwear or pantyhose without a cotton panel. Do not wear thong underwear. Take off any wet clothing, such as bathing suits, as soon as possible. Practice safe sex. Use condoms. Contact a health care provider if: You have pain in the belly or around the pelvis. You have a fever or chills. You have symptoms that last for more than 2-3 days. This information is not intended to replace advice given to you by your health care provider. Make sure you discuss any questions you have with your health care provider. Document Revised: 06/28/2023 Document Reviewed: 02/05/2023 Elsevier Patient Education  2024 [employer].

## 2024-08-18 LAB — CERVICOVAGINAL ANCILLARY ONLY
Bacterial Vaginitis (gardnerella): NEGATIVE
Candida Glabrata: NEGATIVE
Candida Vaginitis: NEGATIVE
Comment: NEGATIVE
Comment: NEGATIVE
Comment: NEGATIVE

## 2024-08-23 ENCOUNTER — Ambulatory Visit: Payer: Self-pay | Admitting: Advanced Practice Midwife

## 2024-09-11 ENCOUNTER — Other Ambulatory Visit: Payer: Self-pay | Admitting: Physician Assistant

## 2024-09-11 DIAGNOSIS — I10 Essential (primary) hypertension: Secondary | ICD-10-CM

## 2024-11-13 ENCOUNTER — Other Ambulatory Visit: Payer: Self-pay | Admitting: Physician Assistant

## 2024-11-13 DIAGNOSIS — F419 Anxiety disorder, unspecified: Secondary | ICD-10-CM
